# Patient Record
Sex: Female | Born: 1995 | Race: White | Hispanic: No | Marital: Married | Smoking: Never smoker
Health system: Southern US, Community
[De-identification: ages and names within clinical notes are randomized; demographics above are authoritative.]

## PROBLEM LIST (undated history)

## (undated) DIAGNOSIS — D649 Anemia, unspecified: Secondary | ICD-10-CM

## (undated) DIAGNOSIS — F419 Anxiety disorder, unspecified: Secondary | ICD-10-CM

## (undated) DIAGNOSIS — F32A Depression, unspecified: Secondary | ICD-10-CM

## (undated) DIAGNOSIS — F329 Major depressive disorder, single episode, unspecified: Secondary | ICD-10-CM

## (undated) HISTORY — PX: TONSILLECTOMY: SUR1361

---
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
## 2017-01-03 DIAGNOSIS — J309 Allergic rhinitis, unspecified: Secondary | ICD-10-CM | POA: Diagnosis not present

## 2017-01-03 DIAGNOSIS — H6691 Otitis media, unspecified, right ear: Secondary | ICD-10-CM | POA: Diagnosis not present

## 2017-06-11 ENCOUNTER — Encounter: Payer: Self-pay | Admitting: Women's Health

## 2017-06-11 ENCOUNTER — Ambulatory Visit: Payer: Commercial Managed Care - PPO | Admitting: Women's Health

## 2017-06-11 VITALS — BP 118/80 | Ht 66.0 in | Wt 151.0 lb

## 2017-06-11 DIAGNOSIS — Z01419 Encounter for gynecological examination (general) (routine) without abnormal findings: Secondary | ICD-10-CM | POA: Diagnosis not present

## 2017-06-11 DIAGNOSIS — N898 Other specified noninflammatory disorders of vagina: Secondary | ICD-10-CM | POA: Diagnosis not present

## 2017-06-11 DIAGNOSIS — Z30011 Encounter for initial prescription of contraceptive pills: Secondary | ICD-10-CM | POA: Diagnosis not present

## 2017-06-11 LAB — UNLABELED: Test Ordered On Req: 6399

## 2017-06-11 LAB — WET PREP FOR TRICH, YEAST, CLUE

## 2017-06-11 MED ORDER — DESOGESTREL-ETHINYL ESTRADIOL 0.15-0.02/0.01 MG (21/5) PO TABS: 1.0000 | ORAL_TABLET | Freq: Every day | ORAL | 4 refills | Status: DC

## 2017-06-11 MED ORDER — DROSPIRENONE-ETHINYL ESTRADIOL 3-0.02 MG PO TABS: 1.0000 | ORAL_TABLET | Freq: Every day | ORAL | 4 refills | Status: DC

## 2017-06-11 NOTE — Progress Notes (Signed)
Molly BurrowVeronica L Martin 09-Aug-1995 [id number]    History:    Presents for annual exam.  Monthly cycle on Mircette, has had increased acne last few months.  Had Gardasil.  Negative STD screen with current partner.  Has always had some anxiety which increased last year saw primary care and is now on Lexapro with good results.  Past medical history, past surgical history, family history and social history were all reviewed and documented in the EPIC chart.  Desk job.  Parents healthy.  ROS:  A ROS was performed and pertinent positives and negatives are included.  Exam:  Vitals:   06/11/17 1212  BP: 118/80  Weight: 151 lb (68.5 kg)  Height: 5\' 6"  (1.676 m)   Body mass index is 24.37 kg/m.   General appearance:  Normal Thyroid:  Symmetrical, normal in size, without palpable masses or nodularity. Respiratory  Auscultation:  Clear without wheezing or rhonchi Cardiovascular  Auscultation:  Regular rate, without rubs, murmurs or gallops  Edema/varicosities:  Not grossly evident Abdominal  Soft,nontender, without masses, guarding or rebound.  Liver/spleen:  No organomegaly noted  Hernia:  None appreciated  Skin  Inspection:  Grossly normal   Breasts: Examined lying and sitting.     Right: Without masses, retractions, discharge or axillary adenopathy.     Left: Without masses, retractions, discharge or axillary adenopathy. Gentitourinary   Inguinal/mons:  Normal without inguinal adenopathy  External genitalia:  Normal  BUS/Urethra/Skene's glands:  Normal  Vagina:  Normal wet prep negative  Cervix:  Normal  Uterus:  normal in size, shape and contour.  Midline and mobile  Adnexa/parametria:     Rt: Without masses or tenderness.   Lt: Without masses or tenderness.  Anus and perineum: Normal    Assessment/Plan:  22 y.o. S WF G0 for annual exam complaint of vaginal irritation, questionable bump near her vagina.  Contraception management Anxiety/depression stable on Lexapro per primary  care  Plan: Contraception options reviewed, will try Yaz prescription, proper use, ways to remember daily pill, instructed to call if no relief of acne.  SBE's, reviewed importance of increasing regular exercise and decreasing calories, has gained 20 pounds in the past year.  MVI daily encouraged.  Ways to prevent vaginal irritation reviewed will call if continued problems, reviewed normality of exam.  CBC,.    Harrington Challengerancy J Alexi Martin [facility]WHNP, 12:53 PM 06/11/2017

## 2017-06-11 NOTE — Patient Instructions (Signed)

## 2017-07-16 DIAGNOSIS — T7840XA Allergy, unspecified, initial encounter: Secondary | ICD-10-CM | POA: Diagnosis not present

## 2017-07-16 DIAGNOSIS — R21 Rash and other nonspecific skin eruption: Secondary | ICD-10-CM | POA: Diagnosis not present

## 2017-09-21 ENCOUNTER — Encounter (HOSPITAL_COMMUNITY): Payer: Self-pay | Admitting: Emergency Medicine

## 2017-09-21 ENCOUNTER — Ambulatory Visit (HOSPITAL_COMMUNITY)
Admission: EM | Admit: 2017-09-21 | Discharge: 2017-09-21 | Disposition: A | Discharge status: Home / Self Care | Payer: Commercial Managed Care - PPO | Attending: Family Medicine | Admitting: Family Medicine

## 2017-09-21 DIAGNOSIS — R21 Rash and other nonspecific skin eruption: Secondary | ICD-10-CM | POA: Diagnosis not present

## 2017-09-21 MED ORDER — PREDNISONE 20 MG PO TABS: 40.0000 mg | ORAL_TABLET | Freq: Every day | ORAL | 0 refills | Status: AC

## 2017-09-21 NOTE — ED Triage Notes (Signed)
Pt c/o rash all over body, legs face, neck arms back.

## 2017-09-21 NOTE — Discharge Instructions (Signed)
Start prednisone as directed. Over the counter allergy medicine such as zyrtec, allegra to help with itching. Ice compress can help with itching. Please monitor for any exposures that could have caused the symptoms. If experiencing worsening symptoms, increased pain, increased warmth to the rashes, spreading redness, fever, follow up for reevaluation. Otherwise, follow up with PCP for further evaluation if symptoms does not resolve.

## 2017-09-21 NOTE — ED Provider Notes (Signed)
[facility]    CSN: [id number] Arrival date & time: 09/21/17  1452     History   Chief Complaint Chief Complaint  Patient presents with  . Rash    HPI Molly Martin is a 22 y.o. female.   22 year old female comes in for 2-day history of rash over her body.  States this started out on the left side of the face, now spreading to the abdomen, thighs.  States initially started itching, now with slight pain.  Denies spreading erythema, increased warmth, fever. Did get a cat 2 weeks ago.  Otherwise no obvious new contact or exposure.  Denies chest pain, shortness of breath, wheezing, swelling of the throat, swelling of the lips.  Has been applying over-the-counter cream with mild improvement.     Past Medical History:  Diagnosis Date  . Anemia   . Anxiety   . Depression     Patient Active Problem List   Diagnosis Date Noted  . Yeast vaginitis 06/08/2016    Past Surgical History:  Procedure Laterality Date  . TONSILLECTOMY      OB History    Gravida  0   Para  0   Term  0   Preterm  0   AB  0   Living  0     SAB  0   TAB  0   Ectopic  0   Multiple  0   Live Births  0            Home Medications    Prior to Admission medications   Medication Sig Start Date End Date Taking? Authorizing Provider  busPIRone HCl (BUSPAR PO) Take by mouth.   Yes [provider]  drospirenone-ethinyl estradiol (YAZ) 3-0.02 MG tablet Take 1 tablet by mouth daily. 06/11/17   Harrington ChallengerYoung, Nancy J, NP  escitalopram (LEXAPRO) 10 MG tablet Take 10 mg by mouth daily.    [provider]  naproxen (NAPROSYN) 500 MG tablet Take 1 tablet (500 mg total) by mouth 2 (two) times daily. 12/15/16   Antony MaduraHumes, Kelly, PA-C  predniSONE (DELTASONE) 20 MG tablet Take 2 tablets (40 mg total) by mouth daily for 5 days. 09/21/17 09/26/17  Belinda FisherYu, Amy V, PA-C    Family History Family History  Problem Relation Age of Onset  . Bipolar disorder Mother   . Hypertension Father     . Depression Sister   . Stroke Maternal Grandmother   . Cancer Maternal Grandfather   . Anxiety disorder Paternal Grandmother     Social History Social History   Tobacco Use  . Smoking status: Never Smoker  . Smokeless tobacco: Never Used  Substance Use Topics  . Alcohol use: No    Frequency: Never  . Drug use: No     Allergies   Patient has no known allergies.   Review of Systems Review of Systems  Reason unable to perform ROS: See HPI as above.     Physical Exam Triage Vital Signs ED Triage Vitals [09/21/17 1557]  Enc Vitals Group     BP 128/63     Pulse Rate 75     Resp 16     Temp (!) 97.4 F (36.3 C)     Temp src      SpO2 100 %     Weight      Height      Head Circumference      Peak Flow      Pain Score 5  Pain Loc      Pain Edu?      Excl. in GC?    No data found.  Updated Vital Signs BP 128/63   Pulse 75   Temp (!) 97.4 F (36.3 C)   Resp 16   SpO2 100%   Physical Exam  Constitutional: She is oriented to person, place, and time. She appears well-developed and well-nourished. No distress.  HENT:  Head: Normocephalic and atraumatic.  Eyes: Pupils are equal, round, and reactive to light. Conjunctivae are normal.  Neurological: She is alert and oriented to person, place, and time.  Skin: Skin is warm and dry. She is not diaphoretic.  See picture below. No increased warmth. Nontender on palpation. No fluctuance felt.               UC Treatments / Results  Labs (all labs ordered are listed, but only abnormal results are displayed) Labs Reviewed - No data to display  EKG None  Radiology No results found.  Procedures Procedures (including critical care time)  Medications Ordered in UC Medications - No data to display  Initial Impression / Assessment and Plan / UC Course  I have reviewed the triage vital signs and the nursing notes.  Pertinent labs & imaging results that were available during my care of the  patient were reviewed by me and considered in my medical decision making (see chart for details).    Will treat for inflammation/deramatitis with prednisone. Antihistamine to help with itching. Patient to monitor for exposures, check cat for fleas. Return precautions given.   Final Clinical Impressions(s) / UC Diagnoses   Final diagnoses:  Rash    ED Prescriptions    Medication Sig Dispense Auth. Provider   predniSONE (DELTASONE) 20 MG tablet Take 2 tablets (40 mg total) by mouth daily for 5 days. 10 tablet Threasa Alpha, PA-C 09/21/17 1800

## 2017-09-28 DIAGNOSIS — Z23 Encounter for immunization: Secondary | ICD-10-CM | POA: Diagnosis not present

## 2017-09-28 DIAGNOSIS — D649 Anemia, unspecified: Secondary | ICD-10-CM | POA: Diagnosis not present

## 2017-10-04 DIAGNOSIS — R251 Tremor, unspecified: Secondary | ICD-10-CM | POA: Diagnosis not present

## 2017-11-08 DIAGNOSIS — Z23 Encounter for immunization: Secondary | ICD-10-CM | POA: Diagnosis not present

## 2017-11-08 DIAGNOSIS — L309 Dermatitis, unspecified: Secondary | ICD-10-CM | POA: Diagnosis not present

## 2017-11-08 DIAGNOSIS — Z0000 Encounter for general adult medical examination without abnormal findings: Secondary | ICD-10-CM | POA: Diagnosis not present

## 2017-11-16 DIAGNOSIS — Z6825 Body mass index (BMI) 25.0-25.9, adult: Secondary | ICD-10-CM | POA: Diagnosis not present

## 2017-11-16 DIAGNOSIS — Z0000 Encounter for general adult medical examination without abnormal findings: Secondary | ICD-10-CM | POA: Diagnosis not present

## 2018-02-25 ENCOUNTER — Encounter: Payer: Self-pay | Admitting: Women's Health

## 2018-02-25 ENCOUNTER — Ambulatory Visit (INDEPENDENT_AMBULATORY_CARE_PROVIDER_SITE_OTHER): Payer: Commercial Managed Care - PPO | Admitting: Women's Health

## 2018-02-25 VITALS — BP 116/70

## 2018-02-25 DIAGNOSIS — Z3041 Encounter for surveillance of contraceptive pills: Secondary | ICD-10-CM

## 2018-02-25 MED ORDER — NORGESTIMATE-ETH ESTRADIOL 0.25-35 MG-MCG PO TABS: 1.0000 | ORAL_TABLET | Freq: Every day | ORAL | 2 refills | Status: DC

## 2018-02-25 NOTE — Progress Notes (Signed)
22 year old MWF G0 presents with questions concerning birth control pills.  States is having a 3-day cycle heavy flow with blood clots and questions if that is normal.  States cycles are lighter flow than prior with less cramping but states blood clots scared her.  Currently on Lexapro and BuSpar per primary care for anxiety/depression which have helped symptoms.  Reports good relationship with husband, negative STD screen.  Denies abdominal/ back pain, urinary symptoms, vaginal discharge or fever.  Exam: Appears well.  No CVAT.  Abdomen soft nontender, external genitalia within normal limits,  Contraception management  Plan: Contraception options reviewed, would like to try a different pill, Sprintec prescription, proper use given and reviewed slight risk for blood clots and strokes. Reassurance given regarding normality of some blood clotting with menses on OCs.

## 2018-03-20 NOTE — L&D Delivery Note (Signed)
Operative Delivery Note   Fetal Bradycardia with pushing down to 60s . At 10:47 PM a viable female was delivered via Vaginal, Vacuum Neurosurgeon).  Presentation: vertex; Position: Right,, Occiput,, Anterior; Station: +3.  Verbal consent: obtained from patient.  Risks and benefits discussed in detail.  Risks include, but are not limited to the risks of anesthesia, bleeding, infection, damage to maternal tissues, fetal cephalhematoma.  There is also the risk of inability to effect vaginal delivery of the head, or shoulder dystocia that cannot be resolved by established maneuvers, leading to the need for emergency cesarean section.  APGAR: 7, 9; weight  pending .   Placenta status:spontaneously with 3 vessel cord , .   Cord:  with the following complications: SHORT.  Cord pH: not obtained  Anesthesia:  epidural Instruments: mushroom - delivered with no popoffs and 4 pulls Episiotomy: None Lacerations:  2nd  Suture Repair: 3.0 chromic Est. Blood Loss (mL): 300  Mom to postpartum.  Baby to Couplet care / Skin to Skin.  MOM TO RECEIVE MAGNESIUM POSTPARTUM FOR PREECLAMPSIA  Cyril Mourning 12/23/2018, 11:07 PM

## 2018-03-31 ENCOUNTER — Emergency Department (HOSPITAL_COMMUNITY): Payer: BLUE CROSS/BLUE SHIELD | Admitting: Certified Registered Nurse Anesthetist

## 2018-03-31 ENCOUNTER — Emergency Department (HOSPITAL_COMMUNITY)
Admission: EM | Admit: 2018-03-31 | Discharge: 2018-03-31 | Disposition: A | Discharge status: Home / Self Care | Payer: BLUE CROSS/BLUE SHIELD | Attending: Emergency Medicine | Admitting: Emergency Medicine

## 2018-03-31 ENCOUNTER — Encounter (HOSPITAL_COMMUNITY): Payer: Self-pay | Admitting: Emergency Medicine

## 2018-03-31 ENCOUNTER — Emergency Department (HOSPITAL_COMMUNITY): Payer: BLUE CROSS/BLUE SHIELD

## 2018-03-31 ENCOUNTER — Other Ambulatory Visit: Payer: Self-pay

## 2018-03-31 ENCOUNTER — Encounter (HOSPITAL_COMMUNITY)
Admission: EM | Disposition: A | Discharge status: Home / Self Care | Payer: Self-pay | Source: Home / Self Care | Attending: Emergency Medicine

## 2018-03-31 DIAGNOSIS — S61257A Open bite of left little finger without damage to nail, initial encounter: Secondary | ICD-10-CM | POA: Insufficient documentation

## 2018-03-31 DIAGNOSIS — Z2914 Encounter for prophylactic rabies immune globin: Secondary | ICD-10-CM | POA: Insufficient documentation

## 2018-03-31 DIAGNOSIS — Y9301 Activity, walking, marching and hiking: Secondary | ICD-10-CM | POA: Diagnosis not present

## 2018-03-31 DIAGNOSIS — F329 Major depressive disorder, single episode, unspecified: Secondary | ICD-10-CM | POA: Diagnosis not present

## 2018-03-31 DIAGNOSIS — L089 Local infection of the skin and subcutaneous tissue, unspecified: Secondary | ICD-10-CM | POA: Diagnosis not present

## 2018-03-31 DIAGNOSIS — W540XXA Bitten by dog, initial encounter: Secondary | ICD-10-CM | POA: Insufficient documentation

## 2018-03-31 DIAGNOSIS — Z793 Long term (current) use of hormonal contraceptives: Secondary | ICD-10-CM | POA: Insufficient documentation

## 2018-03-31 DIAGNOSIS — F419 Anxiety disorder, unspecified: Secondary | ICD-10-CM | POA: Insufficient documentation

## 2018-03-31 DIAGNOSIS — Z791 Long term (current) use of non-steroidal anti-inflammatories (NSAID): Secondary | ICD-10-CM | POA: Diagnosis not present

## 2018-03-31 DIAGNOSIS — Z79899 Other long term (current) drug therapy: Secondary | ICD-10-CM | POA: Diagnosis not present

## 2018-03-31 DIAGNOSIS — Z23 Encounter for immunization: Secondary | ICD-10-CM | POA: Diagnosis not present

## 2018-03-31 DIAGNOSIS — S6992XA Unspecified injury of left wrist, hand and finger(s), initial encounter: Secondary | ICD-10-CM | POA: Diagnosis present

## 2018-03-31 DIAGNOSIS — S61452A Open bite of left hand, initial encounter: Secondary | ICD-10-CM | POA: Diagnosis present

## 2018-03-31 HISTORY — PX: I&D EXTREMITY: SHX5045

## 2018-03-31 LAB — CBC WITH DIFFERENTIAL/PLATELET
Abs Immature Granulocytes: 0.02 10*3/uL (ref 0.00–0.07)
Basophils Absolute: 0 10*3/uL (ref 0.0–0.1)
Basophils Relative: 0 %
EOS ABS: 0.1 10*3/uL (ref 0.0–0.5)
Eosinophils Relative: 2 %
HCT: 39.8 % (ref 36.0–46.0)
Hemoglobin: 13 g/dL (ref 12.0–15.0)
IMMATURE GRANULOCYTES: 0 %
LYMPHS ABS: 2.8 10*3/uL (ref 0.7–4.0)
Lymphocytes Relative: 33 %
MCH: 28.3 pg (ref 26.0–34.0)
MCHC: 32.7 g/dL (ref 30.0–36.0)
MCV: 86.7 fL (ref 80.0–100.0)
Monocytes Absolute: 0.6 10*3/uL (ref 0.1–1.0)
Monocytes Relative: 7 %
Neutro Abs: 4.8 10*3/uL (ref 1.7–7.7)
Neutrophils Relative %: 58 %
Platelets: 302 10*3/uL (ref 150–400)
RBC: 4.59 MIL/uL (ref 3.87–5.11)
RDW: 11.7 % (ref 11.5–15.5)
WBC: 8.4 10*3/uL (ref 4.0–10.5)
nRBC: 0 % (ref 0.0–0.2)

## 2018-03-31 LAB — BASIC METABOLIC PANEL
Anion gap: 6 (ref 5–15)
BUN: 12 mg/dL (ref 6–20)
CALCIUM: 9.1 mg/dL (ref 8.9–10.3)
CO2: 26 mmol/L (ref 22–32)
Chloride: 106 mmol/L (ref 98–111)
Creatinine, Ser: 0.69 mg/dL (ref 0.44–1.00)
GFR calc Af Amer: >60 mL/min (ref >60)
GFR calc non Af Amer: >60 mL/min (ref >60)
Glucose, Bld: 121 mg/dL — ABNORMAL HIGH (ref 70–99)
Potassium: 3.6 mmol/L (ref 3.5–5.1)
Sodium: 138 mmol/L (ref 135–145)

## 2018-03-31 LAB — I-STAT BETA HCG BLOOD, ED (MC, WL, AP ONLY): I-stat hCG, quantitative: <5 m[IU]/mL (ref <5)

## 2018-03-31 SURGERY — IRRIGATION AND DEBRIDEMENT EXTREMITY
Anesthesia: General | Laterality: Right

## 2018-03-31 MED ORDER — IBUPROFEN 600 MG PO TABS: 600.0000 mg | ORAL_TABLET | Freq: Three times a day (TID) | ORAL | 0 refills | Status: DC | PRN

## 2018-03-31 MED ORDER — CEFAZOLIN SODIUM-DEXTROSE 2-3 GM-%(50ML) IV SOLR
INTRAVENOUS | Status: DC | PRN
  Administered 2018-03-31: 2 g via INTRAVENOUS

## 2018-03-31 MED ORDER — ACETAMINOPHEN 10 MG/ML IV SOLN: 1000.0000 mg | Freq: Once | INTRAVENOUS | Status: DC

## 2018-03-31 MED ORDER — HYDROCODONE-ACETAMINOPHEN 5-325 MG PO TABS: 1.0000 | ORAL_TABLET | Freq: Four times a day (QID) | ORAL | 0 refills | Status: DC | PRN

## 2018-03-31 MED ORDER — PHENYLEPHRINE 40 MCG/ML (10ML) SYRINGE FOR IV PUSH (FOR BLOOD PRESSURE SUPPORT)
PREFILLED_SYRINGE | INTRAVENOUS | Status: AC
  Filled 2018-03-31: qty 10

## 2018-03-31 MED ORDER — ONDANSETRON HCL 4 MG PO TABS: 4.0000 mg | ORAL_TABLET | Freq: Three times a day (TID) | ORAL | 0 refills | Status: DC | PRN

## 2018-03-31 MED ORDER — RABIES IMMUNE GLOBULIN 150 UNIT/ML IM INJ
20.0000 [IU]/kg | INJECTION | Freq: Once | INTRAMUSCULAR | Status: AC
  Administered 2018-03-31: 1500 [IU] via INTRAMUSCULAR
  Filled 2018-03-31: qty 10

## 2018-03-31 MED ORDER — ONDANSETRON HCL 4 MG/2ML IJ SOLN
INTRAMUSCULAR | Status: AC
  Filled 2018-03-31: qty 2

## 2018-03-31 MED ORDER — HYDROMORPHONE HCL 1 MG/ML IJ SOLN: 0.2500 mg | INTRAMUSCULAR | Status: DC | PRN

## 2018-03-31 MED ORDER — PROPOFOL 10 MG/ML IV BOLUS
INTRAVENOUS | Status: DC | PRN
  Administered 2018-03-31: 200 mg via INTRAVENOUS

## 2018-03-31 MED ORDER — RABIES VACCINE, PCEC IM SUSR
1.0000 mL | Freq: Once | INTRAMUSCULAR | Status: AC
  Administered 2018-03-31: 1 mL via INTRAMUSCULAR
  Filled 2018-03-31: qty 1

## 2018-03-31 MED ORDER — ACETAMINOPHEN 10 MG/ML IV SOLN
INTRAVENOUS | Status: AC
  Administered 2018-03-31: 1000 mg
  Filled 2018-03-31: qty 100

## 2018-03-31 MED ORDER — DEXAMETHASONE SODIUM PHOSPHATE 10 MG/ML IJ SOLN
INTRAMUSCULAR | Status: DC | PRN
  Administered 2018-03-31: 8 mg via INTRAVENOUS

## 2018-03-31 MED ORDER — ARTIFICIAL TEARS OP OINT
TOPICAL_OINTMENT | OPHTHALMIC | Status: AC
  Filled 2018-03-31: qty 3.5

## 2018-03-31 MED ORDER — LACTATED RINGERS IV SOLN
INTRAVENOUS | Status: DC | PRN
  Administered 2018-03-31: 19:00:00 via INTRAVENOUS

## 2018-03-31 MED ORDER — KETOROLAC TROMETHAMINE 30 MG/ML IJ SOLN: 30.0000 mg | Freq: Once | INTRAMUSCULAR | Status: DC | PRN

## 2018-03-31 MED ORDER — ONDANSETRON HCL 4 MG/2ML IJ SOLN: 4.0000 mg | Freq: Once | INTRAMUSCULAR | Status: DC | PRN

## 2018-03-31 MED ORDER — MIDAZOLAM HCL 5 MG/5ML IJ SOLN
INTRAMUSCULAR | Status: DC | PRN
  Administered 2018-03-31: 2 mg via INTRAVENOUS

## 2018-03-31 MED ORDER — HYDROMORPHONE HCL 1 MG/ML IJ SOLN
INTRAMUSCULAR | Status: AC
  Filled 2018-03-31: qty 1

## 2018-03-31 MED ORDER — DEXAMETHASONE SODIUM PHOSPHATE 10 MG/ML IJ SOLN
INTRAMUSCULAR | Status: AC
  Filled 2018-03-31: qty 1

## 2018-03-31 MED ORDER — MEPERIDINE HCL 50 MG/ML IJ SOLN: 6.2500 mg | INTRAMUSCULAR | Status: DC | PRN

## 2018-03-31 MED ORDER — LIDOCAINE 2% (20 MG/ML) 5 ML SYRINGE
INTRAMUSCULAR | Status: DC | PRN
  Administered 2018-03-31: 100 mg via INTRAVENOUS

## 2018-03-31 MED ORDER — SODIUM CHLORIDE 0.9 % IR SOLN
Status: DC | PRN
  Administered 2018-03-31: 1000 mL

## 2018-03-31 MED ORDER — FENTANYL CITRATE (PF) 100 MCG/2ML IJ SOLN
INTRAMUSCULAR | Status: AC
  Filled 2018-03-31: qty 2

## 2018-03-31 MED ORDER — ONDANSETRON HCL 4 MG/2ML IJ SOLN
INTRAMUSCULAR | Status: DC | PRN
  Administered 2018-03-31: 4 mg via INTRAVENOUS

## 2018-03-31 MED ORDER — AMOXICILLIN-POT CLAVULANATE 875-125 MG PO TABS: 1.0000 | ORAL_TABLET | Freq: Two times a day (BID) | ORAL | 0 refills | Status: AC

## 2018-03-31 MED ORDER — HYDROCODONE-ACETAMINOPHEN 7.5-325 MG PO TABS: 1.0000 | ORAL_TABLET | Freq: Once | ORAL | Status: DC | PRN

## 2018-03-31 MED ORDER — PROPOFOL 10 MG/ML IV BOLUS
INTRAVENOUS | Status: AC
  Filled 2018-03-31: qty 20

## 2018-03-31 MED ORDER — MIDAZOLAM HCL 2 MG/2ML IJ SOLN
INTRAMUSCULAR | Status: AC
  Filled 2018-03-31: qty 2

## 2018-03-31 MED ORDER — LORAZEPAM 2 MG/ML IJ SOLN
1.0000 mg | Freq: Once | INTRAMUSCULAR | Status: AC
  Administered 2018-03-31: 1 mg via INTRAVENOUS
  Filled 2018-03-31: qty 1

## 2018-03-31 MED ORDER — PHENYLEPHRINE 40 MCG/ML (10ML) SYRINGE FOR IV PUSH (FOR BLOOD PRESSURE SUPPORT)
PREFILLED_SYRINGE | INTRAVENOUS | Status: DC | PRN
  Administered 2018-03-31: 40 ug via INTRAVENOUS

## 2018-03-31 MED ORDER — TETANUS-DIPHTH-ACELL PERTUSSIS 5-2.5-18.5 LF-MCG/0.5 IM SUSP
0.5000 mL | Freq: Once | INTRAMUSCULAR | Status: AC
  Administered 2018-03-31: 0.5 mL via INTRAMUSCULAR
  Filled 2018-03-31: qty 0.5

## 2018-03-31 MED ORDER — HYDROMORPHONE HCL 1 MG/ML IJ SOLN
0.2500 mg | INTRAMUSCULAR | Status: DC | PRN
  Administered 2018-03-31 (×2): 0.5 mg via INTRAVENOUS

## 2018-03-31 MED ORDER — LIDOCAINE 2% (20 MG/ML) 5 ML SYRINGE
INTRAMUSCULAR | Status: AC
  Filled 2018-03-31: qty 5

## 2018-03-31 MED ORDER — LACTATED RINGERS IV SOLN: INTRAVENOUS | Status: DC

## 2018-03-31 MED ORDER — SUCCINYLCHOLINE CHLORIDE 200 MG/10ML IV SOSY
PREFILLED_SYRINGE | INTRAVENOUS | Status: DC | PRN
  Administered 2018-03-31: 80 mg via INTRAVENOUS

## 2018-03-31 MED ORDER — CEFAZOLIN SODIUM-DEXTROSE 2-4 GM/100ML-% IV SOLN
INTRAVENOUS | Status: AC
  Filled 2018-03-31: qty 100

## 2018-03-31 MED ORDER — FENTANYL CITRATE (PF) 100 MCG/2ML IJ SOLN
INTRAMUSCULAR | Status: DC | PRN
  Administered 2018-03-31: 75 ug via INTRAVENOUS
  Administered 2018-03-31: 25 ug via INTRAVENOUS
  Administered 2018-03-31 (×2): 50 ug via INTRAVENOUS

## 2018-03-31 SURGICAL SUPPLY — 32 items
BANDAGE ACE 4X5 VEL STRL LF (GAUZE/BANDAGES/DRESSINGS) IMPLANT
BANDAGE ELASTIC 4 VELCRO ST LF (GAUZE/BANDAGES/DRESSINGS) ×3 IMPLANT
BNDG ELASTIC 2 VLCR STRL LF (GAUZE/BANDAGES/DRESSINGS) ×3 IMPLANT
BNDG GAUZE ELAST 4 BULKY (GAUZE/BANDAGES/DRESSINGS) ×3 IMPLANT
COVER SURGICAL LIGHT HANDLE (MISCELLANEOUS) ×3 IMPLANT
COVER WAND RF STERILE (DRAPES) IMPLANT
CUFF TOURN SGL QUICK 18 (TOURNIQUET CUFF) ×3 IMPLANT
DRAIN PENROSE 18X1/4 LTX STRL (WOUND CARE) IMPLANT
DRSG EMULSION OIL 3X3 NADH (GAUZE/BANDAGES/DRESSINGS) ×3 IMPLANT
DRSG PAD ABDOMINAL 8X10 ST (GAUZE/BANDAGES/DRESSINGS) IMPLANT
DURAPREP 26ML APPLICATOR (WOUND CARE) ×3 IMPLANT
ELECT REM PT RETURN 15FT ADLT (MISCELLANEOUS) ×3 IMPLANT
GAUZE SPONGE 4X4 12PLY STRL (GAUZE/BANDAGES/DRESSINGS) IMPLANT
GLOVE BIO SURGEON STRL SZ8 (GLOVE) ×3 IMPLANT
GOWN STRL REUS W/TWL XL LVL3 (GOWN DISPOSABLE) ×3 IMPLANT
KIT BASIN OR (CUSTOM PROCEDURE TRAY) ×3 IMPLANT
LOOP VESSEL MAXI BLUE (MISCELLANEOUS) ×3 IMPLANT
MANIFOLD NEPTUNE II (INSTRUMENTS) ×3 IMPLANT
PACK ORTHO EXTREMITY (CUSTOM PROCEDURE TRAY) ×3 IMPLANT
PROTECTOR NERVE ULNAR (MISCELLANEOUS) ×3 IMPLANT
SOL PREP POV-IOD 4OZ 10% (MISCELLANEOUS) ×3 IMPLANT
SOL PREP PROV IODINE SCRUB 4OZ (MISCELLANEOUS) ×3 IMPLANT
SUT PROLENE 3 0 PS 2 (SUTURE) IMPLANT
SUT VIC AB 1 CT1 27 (SUTURE)
SUT VIC AB 1 CT1 27XBRD ANTBC (SUTURE) IMPLANT
SUT VIC AB 2-0 CT1 27 (SUTURE)
SUT VIC AB 2-0 CT1 27XBRD (SUTURE) IMPLANT
SWAB COLLECTION DEVICE MRSA (MISCELLANEOUS) ×3 IMPLANT
SWAB CULTURE ESWAB REG 1ML (MISCELLANEOUS) ×3 IMPLANT
SYR 20CC LL (SYRINGE) IMPLANT
SYR CONTROL 10ML LL (SYRINGE) IMPLANT
TOWEL OR 17X26 10 PK STRL BLUE (TOWEL DISPOSABLE) ×3 IMPLANT

## 2018-03-31 NOTE — Consult Note (Signed)
ORTHOPAEDIC CONSULTATION  REQUESTING PHYSICIAN: Nat Christen, MD  Chief Complaint: Left small finger dog bite  HPI: Molly Martin is a 22 y.o. female who complains of a dog bite to her volar small finger yesterday. She has developed pain swelling and redness over the night and today. Pain with any motion at the finger at this point.   Past Medical History:  Diagnosis Date  . Anemia   . Anxiety   . Depression    Past Surgical History:  Procedure Laterality Date  . TONSILLECTOMY     Social History   Socioeconomic History  . Marital status: Single    Spouse name: Not on file  . Number of children: Not on file  . Years of education: Not on file  . Highest education level: Not on file  Occupational History  . Not on file  Social Needs  . Financial resource strain: Not on file  . Food insecurity:    Worry: Not on file    Inability: Not on file  . Transportation needs:    Medical: Not on file    Non-medical: Not on file  Tobacco Use  . Smoking status: Never Smoker  . Smokeless tobacco: Never Used  Substance and Sexual Activity  . Alcohol use: No    Frequency: Never  . Drug use: No  . Sexual activity: Yes    Partners: Male    Birth control/protection: Condom    Comment: 17, NO MORE THAN 5 PARTNERS  Lifestyle  . Physical activity:    Days per week: Not on file    Minutes per session: Not on file  . Stress: Not on file  Relationships  . Social connections:    Talks on phone: Not on file    Gets together: Not on file    Attends religious service: Not on file    Active member of club or organization: Not on file    Attends meetings of clubs or organizations: Not on file    Relationship status: Not on file  Other Topics Concern  . Not on file  Social History Narrative  . Not on file   Family History  Problem Relation Age of Onset  . Bipolar disorder Mother   . Hypertension Father   . Depression Sister   . Stroke Maternal Grandmother   . Cancer  Maternal Grandfather   . Anxiety disorder Paternal Grandmother    No Known Allergies Prior to Admission medications   Medication Sig Start Date End Date Taking? Authorizing Provider  busPIRone HCl (BUSPAR PO) Take by mouth.    [provider]  escitalopram (LEXAPRO) 10 MG tablet Take 10 mg by mouth daily.    [provider]  naproxen (NAPROSYN) 500 MG tablet Take 1 tablet (500 mg total) by mouth 2 (two) times daily. 12/15/16   Antonietta Breach, PA-C  norgestimate-ethinyl estradiol (ORTHO-CYCLEN,SPRINTEC,PREVIFEM) 0.25-35 MG-MCG tablet Take 1 tablet by mouth daily. 02/25/18   Huel Cote, NP   No results found.  Positive ROS: All other systems have been reviewed and were otherwise negative with the exception of those mentioned in the HPI and as above.  Labs cbc Recent Labs    03/31/18 1705  WBC 8.4  HGB 13.0  HCT 39.8  PLT 302    Labs inflam No results for input(s): CRP in the last 72 hours.  Invalid input(s): ESR  Labs coag No results for input(s): INR, PTT in the last 72 hours.  Invalid input(s):  PT  No results for input(s): NA, K, CL, CO2, GLUCOSE, BUN, CREATININE, CALCIUM in the last 72 hours.  Physical Exam: Vitals:   03/31/18 1616  BP: 122/73  Pulse: 84  Resp: 16  Temp: 98.5 F (36.9 C)  SpO2: 96%   General: Alert, no acute distress Cardiovascular: No pedal edema Respiratory: No cyanosis, no use of accessory musculature GI: No organomegaly, abdomen is soft and non-tender Skin: No lesions in the area of chief complaint other than those listed below in MSK exam.  Neurologic: Sensation intact distally save for the below mentioned MSK exam Psychiatric: Patient is competent for consent with normal mood and affect Lymphatic: No axillary or cervical lymphadenopathy  MUSCULOSKELETAL:  LUE: pain with ROM, limited activ flexion. Erythema over P1 and MP flexor crease. NVI. Swelling over P1 and just proximal to flexor crease of MP joint.  Other  extremities are atraumatic with painless ROM and NVI.  Assessment: Volar finger infection, possible extension to flexor tendon.   Plan: I discussed the R&B of plan with the patient and her husband. Given possible flexor tendon involvment and possible infection to the tendon sheath I recommend I&D of the volar finger, possible tenolysis.  Will start abx post op and follow cultures.  Discussed possible post op discharge with patient and husband and they would like to go forward with this if possible.    Renette Butters, MD Cell [phone]   03/31/2018 5:49 PM

## 2018-03-31 NOTE — Discharge Instructions (Signed)
Elevate hand at all times.  Diet: As you were doing prior to hospitalization   Dressing:  Keep dressings on and dry until follow up.  Activity:  Increase activity slowly as tolerated, but follow the weight bearing instructions below.  The rules on driving is that you can not be taking narcotics while you drive, and you must feel in control of the vehicle.    Weight Bearing:   Do not lift with left hand.  Elevate at all times to reduce pain / swelling.    To prevent constipation: you may use a stool softener such as -  Colace (over the counter) 100 mg by mouth twice a day  Drink plenty of fluids (prune juice may be helpful) and high fiber foods Miralax (over the counter) for constipation as needed.    Itching:  If you experience itching with your medications, try taking only a single pain pill, or even half a pain pill at a time.  You can also use benadryl over the counter for itching or also to help with sleep.   Precautions:  If you experience chest pain or shortness of breath - call 911 immediately for transfer to the hospital emergency department!!  If you develop a fever greater that 101 F, purulent drainage from wound, increased redness or drainage from wound, or calf pain -- Call the office at [phone]                                                 Follow- Up Appointment:  Please call for an appointment to be seen this Friday 04/05/18 in [place] - [phone]

## 2018-03-31 NOTE — Anesthesia Preprocedure Evaluation (Addendum)
Anesthesia Evaluation  Patient identified by MRN, date of birth, ID band Patient awake    Reviewed: Allergy & Precautions, H&P , NPO status , Patient's Chart, lab work & pertinent test results  Airway Mallampati: II  TM Distance: >3 FB Neck ROM: Full    Dental no notable dental hx. (+) Teeth Intact, Dental Advisory Given   Pulmonary neg pulmonary ROS,    Pulmonary exam normal breath sounds clear to auscultation       Cardiovascular Exercise Tolerance: Good negative cardio ROS Normal cardiovascular exam Rhythm:Regular Rate:Normal     Neuro/Psych Anxiety negative neurological ROS     GI/Hepatic negative GI ROS, Neg liver ROS,   Endo/Other  negative endocrine ROS  Renal/GU negative Renal ROS     Musculoskeletal negative musculoskeletal ROS (+)   Abdominal   Peds  Hematology  (+) Blood dyscrasia, anemia ,   Anesthesia Other Findings   Reproductive/Obstetrics                            Lab Results  Component Value Date   CREATININE 0.69 03/31/2018   BUN 12 03/31/2018   NA 138 03/31/2018   K 3.6 03/31/2018   CL 106 03/31/2018   CO2 26 03/31/2018    Lab Results  Component Value Date   WBC 8.4 03/31/2018   HGB 13.0 03/31/2018   HCT 39.8 03/31/2018   MCV 86.7 03/31/2018   PLT 302 03/31/2018    Anesthesia Physical Anesthesia Plan  ASA: I and emergent  Anesthesia Plan: General   Post-op Pain Management:    Induction: Intravenous, Cricoid pressure planned and Rapid sequence  PONV Risk Score and Plan: 3 and Treatment may vary due to age or medical condition, Ondansetron and Dexamethasone  Airway Management Planned: Oral ETT  Additional Equipment:   Intra-op Plan:   Post-operative Plan: Extubation in OR  Informed Consent: I have reviewed the patients History and Physical, chart, labs and discussed the procedure including the risks, benefits and alternatives for the proposed  anesthesia with the patient or authorized representative who has indicated his/her understanding and acceptance.   Dental advisory given  Plan Discussed with: CRNA  Anesthesia Plan Comments:        Anesthesia Quick Evaluation

## 2018-03-31 NOTE — ED Provider Notes (Signed)
[facility]-EMERGENCY DEPT Provider Note   CSN: [id number] Arrival date & time: 03/31/18  1558     History   Chief Complaint Chief Complaint  Patient presents with  . Animal Bite    HPI Molly Martin is a 23 y.o. female with a PMHx of anxiety, anemia, and depression, who presents to the ED with complaints of left hand dog bite that occurred yesterday.  Patient states that she tried to separate a stray dog from her dog, and the stray dog bit her.  They contacted animal control but the dog was unable to be found.  They are unsure of the vaccine status of the dog.  She states that today she woke up with her left pinky swollen and painful, she describes the pain is sick/10 constant throbbing left pinky pain that radiates into the hand, worse with movement, and unrelieved with Tylenol and ice.  She reports associated puncture wound, bleeding has stopped, but she has some white drainage coming from it.  She reports associated swelling, erythema, and warmth.  She denies any red streaking up the arm, fevers, chills, numbness, tingling, focal weakness, or any other complaints at this time.  Her tetanus shot was more than 5 years ago.  The history is provided by the patient and medical records. No language interpreter was used.  Animal Bite  Associated symptoms: no fever and no numbness     Past Medical History:  Diagnosis Date  . Anemia   . Anxiety   . Depression     Patient Active Problem List   Diagnosis Date Noted  . Yeast vaginitis 06/08/2016    Past Surgical History:  Procedure Laterality Date  . TONSILLECTOMY       OB History    Gravida  0   Para  0   Term  0   Preterm  0   AB  0   Living  0     SAB  0   TAB  0   Ectopic  0   Multiple  0   Live Births  0            Home Medications    Prior to Admission medications   Medication Sig Start Date End Date Taking? Authorizing Provider  busPIRone HCl (BUSPAR PO) Take by mouth.     [provider]  escitalopram (LEXAPRO) 10 MG tablet Take 10 mg by mouth daily.    [provider]  naproxen (NAPROSYN) 500 MG tablet Take 1 tablet (500 mg total) by mouth 2 (two) times daily. 12/15/16   Antony MaduraHumes, Kelly, PA-C  norgestimate-ethinyl estradiol (ORTHO-CYCLEN,SPRINTEC,PREVIFEM) 0.25-35 MG-MCG tablet Take 1 tablet by mouth daily. 02/25/18   Harrington ChallengerYoung, Nancy J, NP    Family History Family History  Problem Relation Age of Onset  . Bipolar disorder Mother   . Hypertension Father   . Depression Sister   . Stroke Maternal Grandmother   . Cancer Maternal Grandfather   . Anxiety disorder Paternal Grandmother     Social History Social History   Tobacco Use  . Smoking status: Never Smoker  . Smokeless tobacco: Never Used  Substance Use Topics  . Alcohol use: No    Frequency: Never  . Drug use: No     Allergies   Patient has no known allergies.   Review of Systems Review of Systems  Constitutional: Negative for chills and fever.  Musculoskeletal: Positive for arthralgias, joint swelling and myalgias.  Skin: Positive for color change  and wound.  Allergic/Immunologic: Negative for immunocompromised state.  Neurological: Negative for weakness and numbness.     Physical Exam Updated Vital Signs BP 122/73 (BP Location: Right Arm)   Pulse 84   Temp 98.5 F (36.9 C) (Oral)   Resp 16   LMP 03/03/2018 (Approximate)   SpO2 96%   Physical Exam Vitals signs and nursing note reviewed.  Constitutional:      General: She is not in acute distress.    Appearance: Normal appearance. She is well-developed. She is not toxic-appearing.     Comments: Afebrile, nontoxic, NAD  HENT:     Head: Normocephalic and atraumatic.  Eyes:     General:        Right eye: No discharge.        Left eye: No discharge.     Conjunctiva/sclera: Conjunctivae normal.  Neck:     Musculoskeletal: Normal range of motion and neck supple.  Cardiovascular:     Rate and Rhythm: Normal  rate.     Pulses: Normal pulses.  Pulmonary:     Effort: Pulmonary effort is normal. No respiratory distress.  Abdominal:     General: There is no distension.  Musculoskeletal:     Left hand: She exhibits decreased range of motion, tenderness, bony tenderness, laceration and swelling. She exhibits normal two-point discrimination, normal capillary refill and no deformity. Normal sensation noted. Normal strength noted.     Comments: L pinky with swelling, warmth, and erythema along the palmar aspect of the digit, small puncture wound to the PIP joint region on the palmar aspect, no drainage appreciated, slightly indurated without fluctuance, no red streaking but erythema extends to palm, limited ROM due to pain and swelling, no crepitus or deformity, moderate TTP along digit particularly on the palmar aspect, pain with passive ROM both flexion and extension. SEE PICTURE BELOW. Strength and sensation grossly intact, distal pulses intact, soft compartments.   Skin:    General: Skin is warm and dry.     Findings: Signs of injury present. No rash.     Comments: L pinky wound as mentioned above and pictured below  Neurological:     Mental Status: She is alert and oriented to person, place, and time.     Sensory: Sensation is intact. No sensory deficit.     Motor: Motor function is intact.  Psychiatric:        Mood and Affect: Mood and affect normal.        Behavior: Behavior normal.        ED Treatments / Results  Labs (all labs ordered are listed, but only abnormal results are displayed) Labs Reviewed  BASIC METABOLIC PANEL - Abnormal; Notable for the following components:      Result Value   Glucose, Bld 121 (*)    All other components within normal limits  CBC WITH DIFFERENTIAL/PLATELET  I-STAT BETA HCG BLOOD, ED ([facility], [facility], [facility] ONLY)    EKG None  Radiology No results found.  Procedures Procedures (including critical care time)  Medications Ordered in ED Medications  rabies  immune globulin (HYPERAB/KEDRAB) injection 1,500 Units (has no administration in time range)  rabies vaccine (RABAVERT) injection 1 mL (has no administration in time range)  Tdap (BOOSTRIX) injection 0.5 mL (has no administration in time range)  LORazepam (ATIVAN) injection 1 mg (has no administration in time range)     Initial Impression / Assessment and Plan / ED Course  I have reviewed the triage vital signs and the  nursing notes.  Pertinent labs & imaging results that were available during my care of the patient were reviewed by me and considered in my medical decision making (see chart for details).     23 y.o. female here with stray dog bite that occurred yesterday, now with swollen, red, hot L pinky where there is a puncture wound. Wound on the palmar aspect of the pinky, over the PIP joint. Some pain with flexion and passive extension, limited ROM due to pain and swelling, no fluctuance or drainage, slightly indurated, warm and erythematous. Concern that she may be developing flexor tenosynovitis. Will update Tdap, give rabies vaccine, and get xray, and consult hand surgery. Will reassess shortly.   5:00 PM Dr. Eulah Pont returning page, wants labs, will come see her and decide on operative management or not.   5:58 PM Dr. Eulah Pont down to see pt, will take her to the operating room for I&D. He cancelled the xray, states he didn't need it. Labs reassuring. Will give ativan for anxiety she's having regarding the procedure. Holding orders to be placed by admitting team. Please see their notes for further documentation of care. I appreciate their help with this pleasant pt's care. Pt stable at time of admission/transfer of care.    Final Clinical Impressions(s) / ED Diagnoses   Final diagnoses:  Dog bite, initial encounter  Bite wound of left hand with infection, initial encounter    ED Discharge Orders    [address] [place], [place] 03/31/18 1759    Donnetta Hutching,  MD 03/31/18 [phone]

## 2018-03-31 NOTE — ED Notes (Signed)
Bed: [facility] Expected date:  Expected time:  Means of arrival:  Comments: 

## 2018-03-31 NOTE — OR Nursing (Signed)
Ofirmev 1000 mg given IV.

## 2018-03-31 NOTE — ED Notes (Signed)
Patient transported to X-ray 

## 2018-03-31 NOTE — Anesthesia Procedure Notes (Signed)
Procedure Name: Intubation Date/Time: 03/31/2018 7:43 PM Performed by: Montel Clock, CRNA Pre-anesthesia Checklist: Patient identified, Emergency Drugs available, Suction available, Patient being monitored and Timeout performed Patient Re-evaluated:Patient Re-evaluated prior to induction Oxygen Delivery Method: Circle system utilized Preoxygenation: Pre-oxygenation with 100% oxygen Induction Type: IV induction, Rapid sequence and Cricoid Pressure applied Laryngoscope Size: Mac and 3 Grade View: Grade I Tube type: Oral Tube size: 7.0 mm Number of attempts: 1 Airway Equipment and Method: Stylet Placement Confirmation: ETT inserted through vocal cords under direct vision,  positive ETCO2 and breath sounds checked- equal and bilateral Secured at: 21 cm Tube secured with: Tape Dental Injury: Teeth and Oropharynx as per pre-operative assessment

## 2018-03-31 NOTE — ED Triage Notes (Addendum)
Patient reports bite to left hand by stray dog yesterday. Swelling noted to left fifth finger.

## 2018-03-31 NOTE — Transfer of Care (Signed)
Immediate Anesthesia Transfer of Care Note  Patient: Molly Martin  Procedure(s) Performed: IRRIGATION AND DEBRIDEMENT EXTREMITY right hand (Right )  Patient Location: PACU  Anesthesia Type:General  Level of Consciousness: drowsy and patient cooperative  Airway & Oxygen Therapy: Patient Spontanous Breathing and Patient connected to face mask oxygen  Post-op Assessment: Report given to RN and Post -op Vital signs reviewed and stable  Post vital signs: Reviewed and stable  Last Vitals:  Vitals Value Taken Time  BP    Temp    Pulse    Resp    SpO2      Last Pain:  Vitals:   03/31/18 1616  TempSrc: Oral  PainSc: 6          Complications: No apparent anesthesia complications

## 2018-03-31 NOTE — ED Notes (Addendum)
Surgery at bedside.

## 2018-03-31 NOTE — Anesthesia Postprocedure Evaluation (Signed)
Anesthesia Post Note  Patient: NAVEE PERCY  Procedure(s) Performed: IRRIGATION AND DEBRIDEMENT EXTREMITY right hand (Right )     Patient location during evaluation: PACU Anesthesia Type: General Level of consciousness: awake and alert Pain management: pain level controlled Vital Signs Assessment: post-procedure vital signs reviewed and stable Respiratory status: spontaneous breathing, nonlabored ventilation, respiratory function stable and patient connected to nasal cannula oxygen Cardiovascular status: blood pressure returned to baseline and stable Postop Assessment: no apparent nausea or vomiting Anesthetic complications: no    Last Vitals:  Vitals:   03/31/18 2115 03/31/18 2130  BP: (!) 143/82 137/77  Pulse: (!) 107 (!) 103  Resp: 13   Temp:    SpO2: 99% 100%    Last Pain:  Vitals:   03/31/18 2130  TempSrc:   PainSc: 4                  Trevor Iha

## 2018-04-01 ENCOUNTER — Encounter (HOSPITAL_COMMUNITY): Payer: Self-pay | Admitting: Orthopedic Surgery

## 2018-04-01 NOTE — Op Note (Signed)
03/31/2018  8:27 AM  PATIENT:  Molly Martin    PRE-OPERATIVE DIAGNOSIS:  left small finger injury animal bite  POST-OPERATIVE DIAGNOSIS:  Same  PROCEDURE:  IRRIGATION AND DEBRIDEMENT EXTREMITY right hand  SURGEON:  Sheral Apley, MD  ASSISTANT: Aquilla Hacker, PA-C, he was present and scrubbed throughout the case, critical for completion in a timely fashion, and for retraction, instrumentation, and closure.   ANESTHESIA:   gen  PREOPERATIVE INDICATIONS:  [name] is a  23 y.o. female with a diagnosis of left small finger injury animal bite who failed conservative measures and elected for surgical management.    The risks benefits and alternatives were discussed with the patient preoperatively including but not limited to the risks of infection, bleeding, nerve injury, cardiopulmonary complications, the need for revision surgery, among others, and the patient was willing to proceed.  OPERATIVE IMPLANTS: none  OPERATIVE FINDINGS: purulent fluid  BLOOD LOSS: min  COMPLICATIONS: none  TOURNIQUET TIME: 30 min  OPERATIVE PROCEDURE:  Patient was identified in the preoperative holding area and site was marked by me She was transported to the operating theater and placed on the table in supine position taking care to pad all bony prominences. After a preincinduction time out anesthesia was induced. The left upper extremity was prepped and draped in normal sterile fashion and a pre-incision timeout was performed. She received ancef was held until cultures sent for preoperative antibiotics.   I made a Brunelle incision over her proximal phalanx and MP joint where her redness and tenderness was on the volar aspect of her small finger.  I protected all neurovascular structures.  Purulent fluid was noted beneath the puncture wound of the dog bite.  I then spread down to her flexor tendon sheath.  I identified the A2 pulley over the proximal phalanx and protected this.  I went  just proximal made a small opening of the tendon sheath as well as distal over C1 and spread through here I delivered the flexor tendons just distal to A2 between A2 and A3.  I confirm no purulence in the tendon sheath.  No adhesions were noted.  After this tenolysis I then turned my attention to the debridement  I performed a thorough irrigation before and after tenolysis.  Any necrotic tissue was debrided with a  Raytec.   Again after another thorough irrigation I loosely closed her skin incision.  Sterile dressing was applied bulky dressing she was awoken and taken to PACU in stable condition  POST OPERATIVE PLAN: Mobilize for DVT prophylaxis p.o. antibiotics follow-up culture

## 2018-04-05 LAB — AEROBIC/ANAEROBIC CULTURE W GRAM STAIN (SURGICAL/DEEP WOUND): Culture: NO GROWTH

## 2018-06-13 LAB — OB RESULTS CONSOLE RUBELLA ANTIBODY, IGM: Rubella: IMMUNE

## 2018-06-13 LAB — OB RESULTS CONSOLE HEPATITIS B SURFACE ANTIGEN: Hepatitis B Surface Ag: NEGATIVE

## 2018-06-13 LAB — OB RESULTS CONSOLE RPR: RPR: NONREACTIVE

## 2018-06-17 ENCOUNTER — Encounter: Payer: Commercial Managed Care - PPO | Admitting: Women's Health

## 2018-09-10 ENCOUNTER — Encounter (HOSPITAL_COMMUNITY): Payer: Self-pay | Admitting: *Deleted

## 2018-09-10 ENCOUNTER — Other Ambulatory Visit: Payer: Self-pay

## 2018-09-10 ENCOUNTER — Inpatient Hospital Stay (HOSPITAL_COMMUNITY)
Admission: EM | Admit: 2018-09-10 | Discharge: 2018-09-10 | Disposition: A | Discharge status: Home / Self Care | Payer: BC Managed Care – PPO | Attending: Obstetrics and Gynecology | Admitting: Obstetrics and Gynecology

## 2018-09-10 DIAGNOSIS — Z79899 Other long term (current) drug therapy: Secondary | ICD-10-CM | POA: Diagnosis not present

## 2018-09-10 DIAGNOSIS — F419 Anxiety disorder, unspecified: Secondary | ICD-10-CM | POA: Diagnosis not present

## 2018-09-10 DIAGNOSIS — D649 Anemia, unspecified: Secondary | ICD-10-CM | POA: Insufficient documentation

## 2018-09-10 DIAGNOSIS — F329 Major depressive disorder, single episode, unspecified: Secondary | ICD-10-CM | POA: Insufficient documentation

## 2018-09-10 DIAGNOSIS — Z818 Family history of other mental and behavioral disorders: Secondary | ICD-10-CM | POA: Insufficient documentation

## 2018-09-10 DIAGNOSIS — R1031 Right lower quadrant pain: Secondary | ICD-10-CM

## 2018-09-10 DIAGNOSIS — O9989 Other specified diseases and conditions complicating pregnancy, childbirth and the puerperium: Secondary | ICD-10-CM | POA: Insufficient documentation

## 2018-09-10 DIAGNOSIS — O99012 Anemia complicating pregnancy, second trimester: Secondary | ICD-10-CM | POA: Diagnosis not present

## 2018-09-10 DIAGNOSIS — R102 Pelvic and perineal pain: Secondary | ICD-10-CM | POA: Diagnosis not present

## 2018-09-10 DIAGNOSIS — Z809 Family history of malignant neoplasm, unspecified: Secondary | ICD-10-CM | POA: Diagnosis not present

## 2018-09-10 DIAGNOSIS — O99342 Other mental disorders complicating pregnancy, second trimester: Secondary | ICD-10-CM | POA: Insufficient documentation

## 2018-09-10 DIAGNOSIS — O26892 Other specified pregnancy related conditions, second trimester: Secondary | ICD-10-CM | POA: Diagnosis not present

## 2018-09-10 DIAGNOSIS — Z3A21 21 weeks gestation of pregnancy: Secondary | ICD-10-CM | POA: Diagnosis not present

## 2018-09-10 DIAGNOSIS — N949 Unspecified condition associated with female genital organs and menstrual cycle: Secondary | ICD-10-CM

## 2018-09-10 LAB — COMPREHENSIVE METABOLIC PANEL
ALT: 15 U/L (ref 0–44)
AST: 18 U/L (ref 15–41)
Albumin: 3.1 g/dL — ABNORMAL LOW (ref 3.5–5.0)
Alkaline Phosphatase: 75 U/L (ref 38–126)
Anion gap: 8 (ref 5–15)
BUN: 5 mg/dL — ABNORMAL LOW (ref 6–20)
CO2: 24 mmol/L (ref 22–32)
Calcium: 9.4 mg/dL (ref 8.9–10.3)
Chloride: 105 mmol/L (ref 98–111)
Creatinine, Ser: 0.43 mg/dL — ABNORMAL LOW (ref 0.44–1.00)
GFR calc Af Amer: >60 mL/min (ref >60)
GFR calc non Af Amer: >60 mL/min (ref >60)
Glucose, Bld: 83 mg/dL (ref 70–99)
Potassium: 3.8 mmol/L (ref 3.5–5.1)
Sodium: 137 mmol/L (ref 135–145)
Total Bilirubin: 0.3 mg/dL (ref 0.3–1.2)
Total Protein: 5.5 g/dL — ABNORMAL LOW (ref 6.5–8.1)

## 2018-09-10 LAB — URINALYSIS, ROUTINE W REFLEX MICROSCOPIC
Bilirubin Urine: NEGATIVE
Glucose, UA: NEGATIVE mg/dL
Hgb urine dipstick: NEGATIVE
Ketones, ur: NEGATIVE mg/dL
Nitrite: NEGATIVE
Protein, ur: NEGATIVE mg/dL
Specific Gravity, Urine: 1.014 (ref 1.005–1.030)
pH: 8 (ref 5.0–8.0)

## 2018-09-10 LAB — CBC WITH DIFFERENTIAL/PLATELET
Abs Immature Granulocytes: 0.05 10*3/uL (ref 0.00–0.07)
Basophils Absolute: 0 10*3/uL (ref 0.0–0.1)
Basophils Relative: 0 %
Eosinophils Absolute: 0.1 10*3/uL (ref 0.0–0.5)
Eosinophils Relative: 1 %
HCT: 34.5 % — ABNORMAL LOW (ref 36.0–46.0)
Hemoglobin: 11.9 g/dL — ABNORMAL LOW (ref 12.0–15.0)
Immature Granulocytes: 1 %
Lymphocytes Relative: 16 %
Lymphs Abs: 1.5 10*3/uL (ref 0.7–4.0)
MCH: 29.3 pg (ref 26.0–34.0)
MCHC: 34.5 g/dL (ref 30.0–36.0)
MCV: 85 fL (ref 80.0–100.0)
Monocytes Absolute: 0.7 10*3/uL (ref 0.1–1.0)
Monocytes Relative: 7 %
Neutro Abs: 7.1 10*3/uL (ref 1.7–7.7)
Neutrophils Relative %: 75 %
Platelets: 223 10*3/uL (ref 150–400)
RBC: 4.06 MIL/uL (ref 3.87–5.11)
RDW: 13.2 % (ref 11.5–15.5)
WBC: 9.5 10*3/uL (ref 4.0–10.5)
nRBC: 0 % (ref 0.0–0.2)

## 2018-09-10 LAB — WET PREP, GENITAL
Clue Cells Wet Prep HPF POC: NONE SEEN
Sperm: NONE SEEN
Trich, Wet Prep: NONE SEEN
Yeast Wet Prep HPF POC: NONE SEEN

## 2018-09-10 LAB — LIPASE, BLOOD: Lipase: 21 U/L (ref 11–51)

## 2018-09-10 MED ORDER — IBUPROFEN 600 MG PO TABS
600.0000 mg | ORAL_TABLET | Freq: Once | ORAL | Status: AC
  Administered 2018-09-10: 600 mg via ORAL
  Filled 2018-09-10: qty 1

## 2018-09-10 MED ORDER — IBUPROFEN 600 MG PO TABS: 600.0000 mg | ORAL_TABLET | Freq: Four times a day (QID) | ORAL | 0 refills | Status: DC | PRN

## 2018-09-10 NOTE — Discharge Instructions (Signed)

## 2018-09-10 NOTE — ED Notes (Signed)
Called to report to [facility] transport.

## 2018-09-10 NOTE — MAU Provider Note (Signed)
History     CSN: [id number]  Arrival date and time: 09/10/18 1225   First Provider Initiated Contact with Patient 09/10/18 1333      Chief Complaint  Patient presents with  . Abdominal Pain   G1 @21 .4 wks presenting with acute onset RLQ pain. Pain onset 3-5 am today. Describes as sharp and intermittent, radiates to rt flank and back. Pain is worse with standing, movement, coughing, and deep breaths. Rates 10/10. Took Tylenol around 8am with no relief. Denies heavy lifting, pushing, pulling, or falls. Denies urinary sx. No GI sx. No fevers. Denies VB or cramping. Reports thick white clumpy vaginal discharge. No itching. Her pregnancy is complicated by +Ab screen.  OB History    Gravida  1   Para  0   Term  0   Preterm  0   AB  0   Living  0     SAB  0   TAB  0   Ectopic  0   Multiple  0   Live Births  0           Past Medical History:  Diagnosis Date  . Anemia   . Anxiety   . Depression     Past Surgical History:  Procedure Laterality Date  . I&D EXTREMITY Right 03/31/2018   Procedure: IRRIGATION AND DEBRIDEMENT EXTREMITY right hand;  Surgeon: Sheral ApleyMurphy, Timothy D, MD;  Location: [facility];  Service: Orthopedics;  Laterality: Right;  . TONSILLECTOMY      Family History  Problem Relation Age of Onset  . Bipolar disorder Mother   . Hypertension Father   . Depression Sister   . Stroke Maternal Grandmother   . Cancer Maternal Grandfather   . Anxiety disorder Paternal Grandmother     Social History   Tobacco Use  . Smoking status: Never Smoker  . Smokeless tobacco: Never Used  Substance Use Topics  . Alcohol use: No    Frequency: Never  . Drug use: No    Allergies: No Known Allergies  No medications prior to admission.    Review of Systems  Constitutional: Negative for chills and fever.  Gastrointestinal: Positive for abdominal pain. Negative for constipation, diarrhea, nausea and vomiting.  Genitourinary: Positive for flank pain and vaginal  discharge. Negative for dysuria, frequency, urgency and vaginal bleeding.  Musculoskeletal: Negative for back pain.   Physical Exam   Blood pressure (!) 113/57, pulse 69, temperature 98.7 F (37.1 C), temperature source Oral, resp. rate 19, weight 83.3 kg, last menstrual period 03/03/2018, SpO2 96 %.  Physical Exam  Nursing note and vitals reviewed. Constitutional: She is oriented to person, place, and time. She appears well-developed and well-nourished. No distress.  HENT:  Head: Normocephalic and atraumatic.  Neck: Normal range of motion.  Cardiovascular: Normal rate.  Respiratory: Effort normal. No respiratory distress.  GI: Soft. She exhibits no distension and no mass. There is abdominal tenderness in the right lower quadrant. There is no rebound, no guarding and no CVA tenderness.  Genitourinary:    Genitourinary Comments: External: no lesions or erythema Vagina: rugated, pink, moist, thick light yellow mucous discharge Cervix closed/long    Musculoskeletal: Normal range of motion.     Cervical back: Normal.     Thoracic back: Normal.     Lumbar back: Normal.  Neurological: She is alert and oriented to person, place, and time.  Skin: Skin is warm and dry.  Psychiatric: She has a normal mood and affect.  FHT 151  Results  for orders placed or performed during the hospital encounter of 09/10/18 (from the past 24 hour(s))  Urinalysis, Routine w reflex microscopic     Status: Abnormal   Collection Time: 09/10/18  1:13 PM  Result Value Ref Range   Color, Urine YELLOW YELLOW   APPearance CLOUDY (A) CLEAR   Specific Gravity, Urine 1.014 1.005 - 1.030   pH 8.0 5.0 - 8.0   Glucose, UA NEGATIVE NEGATIVE mg/dL   Hgb urine dipstick NEGATIVE NEGATIVE   Bilirubin Urine NEGATIVE NEGATIVE   Ketones, ur NEGATIVE NEGATIVE mg/dL   Protein, ur NEGATIVE NEGATIVE mg/dL   Nitrite NEGATIVE NEGATIVE   Leukocytes,Ua LARGE (A) NEGATIVE   RBC / HPF 0-5 0 - 5 RBC/hpf   WBC, UA 6-10 0 - 5  WBC/hpf   Bacteria, UA FEW (A) NONE SEEN   Squamous Epithelial / LPF 0-5 0 - 5   Mucus PRESENT    Amorphous Crystal PRESENT   Wet prep, genital     Status: Abnormal   Collection Time: 09/10/18  1:44 PM  Result Value Ref Range   Yeast Wet Prep HPF POC NONE SEEN NONE SEEN   Trich, Wet Prep NONE SEEN NONE SEEN   Clue Cells Wet Prep HPF POC NONE SEEN NONE SEEN   WBC, Wet Prep HPF POC MANY (A) NONE SEEN   Sperm NONE SEEN   CBC with Differential/Platelet     Status: Abnormal   Collection Time: 09/10/18  1:58 PM  Result Value Ref Range   WBC 9.5 4.0 - 10.5 K/uL   RBC 4.06 3.87 - 5.11 MIL/uL   Hemoglobin 11.9 (L) 12.0 - 15.0 g/dL   HCT 16.134.5 (L) 09.636.0 - 04.546.0 %   MCV 85.0 80.0 - 100.0 fL   MCH 29.3 26.0 - 34.0 pg   MCHC 34.5 30.0 - 36.0 g/dL   RDW 40.913.2 81.111.5 - 91.415.5 %   Platelets 223 150 - 400 K/uL   nRBC 0.0 0.0 - 0.2 %   Neutrophils Relative % 75 %   Neutro Abs 7.1 1.7 - 7.7 K/uL   Lymphocytes Relative 16 %   Lymphs Abs 1.5 0.7 - 4.0 K/uL   Monocytes Relative 7 %   Monocytes Absolute 0.7 0.1 - 1.0 K/uL   Eosinophils Relative 1 %   Eosinophils Absolute 0.1 0.0 - 0.5 K/uL   Basophils Relative 0 %   Basophils Absolute 0.0 0.0 - 0.1 K/uL   Immature Granulocytes 1 %   Abs Immature Granulocytes 0.05 0.00 - 0.07 K/uL  Comprehensive metabolic panel     Status: Abnormal   Collection Time: 09/10/18  1:58 PM  Result Value Ref Range   Sodium 137 135 - 145 mmol/L   Potassium 3.8 3.5 - 5.1 mmol/L   Chloride 105 98 - 111 mmol/L   CO2 24 22 - 32 mmol/L   Glucose, Bld 83 70 - 99 mg/dL   BUN 5 (L) 6 - 20 mg/dL   Creatinine, Ser 7.820.43 (L) 0.44 - 1.00 mg/dL   Calcium 9.4 8.9 - 95.610.3 mg/dL   Total Protein 5.5 (L) 6.5 - 8.1 g/dL   Albumin 3.1 (L) 3.5 - 5.0 g/dL   AST 18 15 - 41 U/L   ALT 15 0 - 44 U/L   Alkaline Phosphatase 75 38 - 126 U/L   Total Bilirubin 0.3 0.3 - 1.2 mg/dL   GFR calc non Af Amer >60 >60 mL/min   GFR calc Af Amer >60 >60 mL/min   Anion gap 8  5 - 15  Lipase, blood      Status: None   Collection Time: 09/10/18  1:58 PM  Result Value Ref Range   Lipase 21 11 - 51 U/L   MAU Course  Procedures Orders Placed This Encounter  Procedures  . Culture, OB Urine    Standing Status:   Standing    Number of Occurrences:   1  . Wet prep, genital    Standing Status:   Standing    Number of Occurrences:   1    Order Specific Question:   Patient immune status    Answer:   Normal  . Urinalysis, Routine w reflex microscopic    Standing Status:   Standing    Number of Occurrences:   1  . CBC with Differential/Platelet    Standing Status:   Standing    Number of Occurrences:   1  . Comprehensive metabolic panel    Standing Status:   Standing    Number of Occurrences:   1  . Lipase, blood    Standing Status:   Standing    Number of Occurrences:   1  . Discharge patient    Order Specific Question:   Discharge disposition    Answer:   01-Home or Self Care [1]    Order Specific Question:   Discharge patient date    Answer:   09/10/2018   MDM Labs ordered and reviewed. UA with leuks, WBCs, and bacteria; low suspicion for UTI, will send UC. GC pending. Pain improved. Labs normal. No evidence of acute abd process or PTL. Pain likely MSK/RL. Recommend short course of Ibuprofen and heat. Stable for discharge home.  Assessment and Plan   1. [redacted] weeks gestation of pregnancy   2. Round ligament pain    Discharge home Follow up at [facility] as scheduled Return for worsening sx Rx Ibuprofen  Allergies as of 09/10/2018   No Known Allergies     Medication List    STOP taking these medications   HYDROcodone-acetaminophen 5-325 MG tablet Commonly known as: Norco   norgestimate-ethinyl estradiol 0.25-35 MG-MCG tablet Commonly known as: ORTHO-CYCLEN   ondansetron 4 MG tablet Commonly known as: Zofran     TAKE these medications   BUSPAR PO Take by mouth.   chromagen capsule Take 1 capsule by mouth daily. Taking 65mg  daily   escitalopram 10 MG  tablet Commonly known as: LEXAPRO Take 10 mg by mouth daily.   ibuprofen 600 MG tablet Commonly known as: ADVIL Take 1 tablet (600 mg total) by mouth every 6 (six) hours as needed for moderate pain. For pain / inflammation. What changed: when to take this   prenatal vitamin w/FE, FA 27-1 MG Tabs tablet Take 1 tablet by mouth daily at 12 noon.      Julianne Handler, CNM 09/10/2018, 3:36 PM

## 2018-09-10 NOTE — MAU Note (Signed)
Sharp pain in RLQ, woke her during the night. Has gotten worse.  Pain goes through to back. Comes and goes in waves.   Denies fever, diarrhea or urinary problems.

## 2018-09-11 LAB — CULTURE, OB URINE: Culture: NO GROWTH

## 2018-09-11 LAB — GC/CHLAMYDIA PROBE AMP (~~LOC~~) NOT AT ARMC
Chlamydia: NEGATIVE
Neisseria Gonorrhea: NEGATIVE

## 2018-10-14 LAB — OB RESULTS CONSOLE HIV ANTIBODY (ROUTINE TESTING): HIV: NONREACTIVE

## 2018-11-25 ENCOUNTER — Encounter (HOSPITAL_COMMUNITY): Payer: Self-pay

## 2018-11-25 ENCOUNTER — Inpatient Hospital Stay (HOSPITAL_COMMUNITY)
Admission: AD | Admit: 2018-11-25 | Discharge: 2018-11-25 | Disposition: A | Discharge status: Home / Self Care | Payer: BC Managed Care – PPO | Attending: Obstetrics and Gynecology | Admitting: Obstetrics and Gynecology

## 2018-11-25 ENCOUNTER — Other Ambulatory Visit: Payer: Self-pay

## 2018-11-25 DIAGNOSIS — Z3A32 32 weeks gestation of pregnancy: Secondary | ICD-10-CM | POA: Diagnosis not present

## 2018-11-25 DIAGNOSIS — N930 Postcoital and contact bleeding: Secondary | ICD-10-CM

## 2018-11-25 DIAGNOSIS — O26899 Other specified pregnancy related conditions, unspecified trimester: Secondary | ICD-10-CM

## 2018-11-25 DIAGNOSIS — O1203 Gestational edema, third trimester: Secondary | ICD-10-CM | POA: Diagnosis not present

## 2018-11-25 DIAGNOSIS — O26893 Other specified pregnancy related conditions, third trimester: Secondary | ICD-10-CM | POA: Diagnosis not present

## 2018-11-25 DIAGNOSIS — O99343 Other mental disorders complicating pregnancy, third trimester: Secondary | ICD-10-CM | POA: Diagnosis not present

## 2018-11-25 DIAGNOSIS — F419 Anxiety disorder, unspecified: Secondary | ICD-10-CM | POA: Diagnosis not present

## 2018-11-25 DIAGNOSIS — R102 Pelvic and perineal pain: Secondary | ICD-10-CM | POA: Insufficient documentation

## 2018-11-25 DIAGNOSIS — F329 Major depressive disorder, single episode, unspecified: Secondary | ICD-10-CM | POA: Insufficient documentation

## 2018-11-25 DIAGNOSIS — R1030 Lower abdominal pain, unspecified: Secondary | ICD-10-CM | POA: Insufficient documentation

## 2018-11-25 DIAGNOSIS — R030 Elevated blood-pressure reading, without diagnosis of hypertension: Secondary | ICD-10-CM | POA: Insufficient documentation

## 2018-11-25 DIAGNOSIS — O4693 Antepartum hemorrhage, unspecified, third trimester: Secondary | ICD-10-CM | POA: Diagnosis present

## 2018-11-25 DIAGNOSIS — Z79899 Other long term (current) drug therapy: Secondary | ICD-10-CM | POA: Insufficient documentation

## 2018-11-25 LAB — COMPREHENSIVE METABOLIC PANEL
ALT: 13 U/L (ref 0–44)
AST: 19 U/L (ref 15–41)
Albumin: 2.9 g/dL — ABNORMAL LOW (ref 3.5–5.0)
Alkaline Phosphatase: 119 U/L (ref 38–126)
Anion gap: 8 (ref 5–15)
BUN: <5 mg/dL — ABNORMAL LOW (ref 6–20)
CO2: 25 mmol/L (ref 22–32)
Calcium: 8.8 mg/dL — ABNORMAL LOW (ref 8.9–10.3)
Chloride: 104 mmol/L (ref 98–111)
Creatinine, Ser: 0.64 mg/dL (ref 0.44–1.00)
GFR calc Af Amer: >60 mL/min (ref >60)
GFR calc non Af Amer: >60 mL/min (ref >60)
Glucose, Bld: 86 mg/dL (ref 70–99)
Potassium: 2.8 mmol/L — ABNORMAL LOW (ref 3.5–5.1)
Sodium: 137 mmol/L (ref 135–145)
Total Bilirubin: 0.5 mg/dL (ref 0.3–1.2)
Total Protein: 5.7 g/dL — ABNORMAL LOW (ref 6.5–8.1)

## 2018-11-25 LAB — PROTEIN / CREATININE RATIO, URINE
Creatinine, Urine: 23.75 mg/dL
Total Protein, Urine: <6 mg/dL

## 2018-11-25 LAB — URINALYSIS, ROUTINE W REFLEX MICROSCOPIC
Bilirubin Urine: NEGATIVE
Glucose, UA: NEGATIVE mg/dL
Ketones, ur: NEGATIVE mg/dL
Nitrite: NEGATIVE
Protein, ur: NEGATIVE mg/dL
Specific Gravity, Urine: 1.003 — ABNORMAL LOW (ref 1.005–1.030)
pH: 7 (ref 5.0–8.0)

## 2018-11-25 LAB — CBC
HCT: 33.1 % — ABNORMAL LOW (ref 36.0–46.0)
Hemoglobin: 11.6 g/dL — ABNORMAL LOW (ref 12.0–15.0)
MCH: 30.1 pg (ref 26.0–34.0)
MCHC: 35 g/dL (ref 30.0–36.0)
MCV: 85.8 fL (ref 80.0–100.0)
Platelets: 239 10*3/uL (ref 150–400)
RBC: 3.86 MIL/uL — ABNORMAL LOW (ref 3.87–5.11)
RDW: 12.5 % (ref 11.5–15.5)
WBC: 11.1 10*3/uL — ABNORMAL HIGH (ref 4.0–10.5)
nRBC: 0 % (ref 0.0–0.2)

## 2018-11-25 MED ORDER — COMFORT FIT MATERNITY SUPP LG MISC: 1.0000 [IU] | Freq: Every day | 0 refills | Status: DC

## 2018-11-25 MED ORDER — ACETAMINOPHEN 325 MG PO TABS
650.0000 mg | ORAL_TABLET | Freq: Once | ORAL | Status: AC
  Administered 2018-11-25: 650 mg via ORAL
  Filled 2018-11-25: qty 2

## 2018-11-25 NOTE — MAU Note (Signed)
Pt here with c/o abdominal pain and bleeding that started last night after intercourse. Denies any leaking. Reports some decreased fetal movement.

## 2018-11-25 NOTE — MAU Provider Note (Signed)
History     CSN: [id number]  Arrival date and time: 11/25/18 2011   First Provider Initiated Contact with Patient 11/25/18 2109      Chief Complaint  Patient presents with  . Vaginal Bleeding  . Abdominal Pain   Molly Martin is a 23 y.o. G1P0 at 6820w3d who presents to MAU with complaints of abdominal pain and vaginal bleeding. She reports vaginal bleeding started last night after intercourse, describes vaginal bleeding as a mix of dark brown bleeding and bright red bleeding. She denies clots she denies having to wear a pad, reports wearing a panty liner and noticed staining on the liner. She reports abdominal pain started occurring this evening around 1500, describes lower abdominal pain as "someone gutting me" that gets worse with standing and movement. Rates pain 7/10- has not taken any medication today for pain. Reports taking Tylenol last night for her round ligament pain and the pain resolved. She denies contractions or tightening. Patient is concerned over weight gain in the past 1.5 weeks, reports gaining 10 pounds. Denies complications during pregnancy with hypertension, denies HA, vision changes or RUQ pain.    OB History    Gravida  1   Para  0   Term  0   Preterm  0   AB  0   Living  0     SAB  0   TAB  0   Ectopic  0   Multiple  0   Live Births  0           Past Medical History:  Diagnosis Date  . Anemia   . Anxiety   . Depression     Past Surgical History:  Procedure Laterality Date  . I&D EXTREMITY Right 03/31/2018   Procedure: IRRIGATION AND DEBRIDEMENT EXTREMITY right hand;  Surgeon: Sheral ApleyMurphy, Timothy D, MD;  Location: [facility];  Service: Orthopedics;  Laterality: Right;  . TONSILLECTOMY      Family History  Problem Relation Age of Onset  . Bipolar disorder Mother   . Hypertension Father   . Depression Sister   . Stroke Maternal Grandmother   . Cancer Maternal Grandfather   . Anxiety disorder Paternal Grandmother     Social History    Tobacco Use  . Smoking status: Never Smoker  . Smokeless tobacco: Never Used  Substance Use Topics  . Alcohol use: No    Frequency: Never  . Drug use: No    Allergies: No Known Allergies  Medications Prior to Admission  Medication Sig Dispense Refill Last Dose  . escitalopram (LEXAPRO) 10 MG tablet Take 10 mg by mouth daily.   11/25/2018 at Unknown time  . Iron Combinations (CHROMAGEN) capsule Take 1 capsule by mouth daily. Taking 65mg  daily   11/25/2018 at Unknown time  . prenatal vitamin w/FE, FA (PRENATAL 1 + 1) 27-1 MG TABS tablet Take 1 tablet by mouth daily at 12 noon.   11/25/2018 at Unknown time  . busPIRone HCl (BUSPAR PO) Take by mouth.     Marland Kitchen. ibuprofen (ADVIL) 600 MG tablet Take 1 tablet (600 mg total) by mouth every 6 (six) hours as needed for moderate pain. For pain / inflammation. 8 tablet 0     Review of Systems  Constitutional: Negative.   Respiratory: Negative.   Cardiovascular: Negative.   Gastrointestinal: Positive for abdominal pain. Negative for constipation, diarrhea, nausea and vomiting.  Genitourinary: Positive for vaginal bleeding. Negative for difficulty urinating, dysuria, frequency, hematuria, pelvic pain and urgency.  Musculoskeletal: Negative.   Neurological: Negative.    Physical Exam   Patient Vitals for the past 24 hrs:  BP Temp Temp src Pulse Resp SpO2 Height Weight  11/25/18 2302 135/66 - - 62 17 99 % - -  11/25/18 2216 133/84 - - 67 - - - -  11/25/18 2201 132/80 - - 63 - - - -  11/25/18 2146 135/72 - - 68 - - - -  11/25/18 2131 136/82 - - 72 - - - -  11/25/18 2129 138/90 - - 73 - - - -  11/25/18 2054 138/81 97.9 F (36.6 C) Oral 79 - - - -  11/25/18 2053 138/81 - - 78 - - - -  11/25/18 2032 (!) 147/85 98.4 F (36.9 C) Oral 70 18 100 % 5\' 4"  (1.626 m) 89.4 kg    Physical Exam  Nursing note and vitals reviewed. Constitutional: She is oriented to person, place, and time. She appears well-developed and well-nourished.  Cardiovascular:  Normal rate and regular rhythm.  Respiratory: Effort normal. No respiratory distress. She has no wheezes.  GI: Soft.  Gravid appropriate for gestational age, no RUQ pain or tenderness, no contractions palpated   Genitourinary:    Genitourinary Comments: Pelvic exam: Cervix pink, visually closed- cervix friable with speculum, scant white creamy discharge- no active vaginal bleeding, none on pad, vaginal walls and external genitalia normal   Musculoskeletal: Normal range of motion.        General: Edema present.     Comments: +2 pitting bilateral edema   Neurological: She is alert and oriented to person, place, and time. She displays normal reflexes. She exhibits normal muscle tone.  Skin: Skin is warm and dry.  Psychiatric: She has a normal mood and affect. Her behavior is normal. Thought content normal.   Dilation: Closed Effacement (%): Thick Cervical Position: Posterior Presentation: Vertex Exam by:: Wende Bushy CNM  MAU Course  Procedures  MDM Anterior placenta based on chart review, no placental previa  Vaginal bleeding after intercourse- encouraged pelvic rest for 7 days after bleeding   PEC labs ordered based on elevated BP in triage, 10+ pound weight gain and +2 pitting edema- patient denies HA, vision changes, or RUQ pain.   Tylenol ordered for abdominal pain.  Labs results reviewed:  Results for orders placed or performed during the hospital encounter of 11/25/18 (from the past 24 hour(s))  CBC     Status: Abnormal   Collection Time: 11/25/18  9:34 PM  Result Value Ref Range   WBC 11.1 (H) 4.0 - 10.5 K/uL   RBC 3.86 (L) 3.87 - 5.11 MIL/uL   Hemoglobin 11.6 (L) 12.0 - 15.0 g/dL   HCT 33.1 (L) 36.0 - 46.0 %   MCV 85.8 80.0 - 100.0 fL   MCH 30.1 26.0 - 34.0 pg   MCHC 35.0 30.0 - 36.0 g/dL   RDW 12.5 11.5 - 15.5 %   Platelets 239 150 - 400 K/uL   nRBC 0.0 0.0 - 0.2 %  Comprehensive metabolic panel     Status: Abnormal   Collection Time: 11/25/18  9:34 PM  Result Value  Ref Range   Sodium 137 135 - 145 mmol/L   Potassium 2.8 (L) 3.5 - 5.1 mmol/L   Chloride 104 98 - 111 mmol/L   CO2 25 22 - 32 mmol/L   Glucose, Bld 86 70 - 99 mg/dL   BUN <5 (L) 6 - 20 mg/dL   Creatinine, Ser 0.64 0.44 - 1.00 mg/dL  Calcium 8.8 (L) 8.9 - 10.3 mg/dL   Total Protein 5.7 (L) 6.5 - 8.1 g/dL   Albumin 2.9 (L) 3.5 - 5.0 g/dL   AST 19 15 - 41 U/L   ALT 13 0 - 44 U/L   Alkaline Phosphatase 119 38 - 126 U/L   Total Bilirubin 0.5 0.3 - 1.2 mg/dL   GFR calc non Af Amer >60 >60 mL/min   GFR calc Af Amer >60 >60 mL/min   Anion gap 8 5 - 15  Protein / creatinine ratio, urine     Status: None   Collection Time: 11/25/18  9:38 PM  Result Value Ref Range   Creatinine, Urine 23.75 mg/dL   Total Protein, Urine <6 mg/dL   Protein Creatinine Ratio        0.00 - 0.15 mg/mg[Cre]  Urinalysis, Routine w reflex microscopic     Status: Abnormal   Collection Time: 11/25/18  9:38 PM  Result Value Ref Range   Color, Urine STRAW (A) YELLOW   APPearance CLEAR CLEAR   Specific Gravity, Urine 1.003 (L) 1.005 - 1.030   pH 7.0 5.0 - 8.0   Glucose, UA NEGATIVE NEGATIVE mg/dL   Hgb urine dipstick SMALL (A) NEGATIVE   Bilirubin Urine NEGATIVE NEGATIVE   Ketones, ur NEGATIVE NEGATIVE mg/dL   Protein, ur NEGATIVE NEGATIVE mg/dL   Nitrite NEGATIVE NEGATIVE   Leukocytes,Ua TRACE (A) NEGATIVE   RBC / HPF 0-5 0 - 5 RBC/hpf   WBC, UA 0-5 0 - 5 WBC/hpf   Bacteria, UA RARE (A) NONE SEEN   Squamous Epithelial / LPF 0-5 0 - 5    PEC labs negative  Upon reassessment after labs, patient reports abdominal pain has resolved with tylenol.  No vaginal bleeding on pad or when patient wiped  Educated and discussed reasons to return to MAU, encouraged pelvic rest for 7 days d/t vaginal bleeding after intercourse. Encouraged to drink 6-8 bottles of water per day and to elevate feet to level of breast at home. Follow up as scheduled in the office for prenatal care. Pt discharged home in stable condition.    Assessment and Plan   1. PCB (post coital bleeding)   2. Pain of round ligament during pregnancy   3. Elevated BP without diagnosis of hypertension   4. Edema during pregnancy in third trimester    Discharge home Rx for maternity support belt  Follow up as scheduled in the office for prenatal care Return to MAU as needed for reasons discussed and/or emergencies  Pelvic rest x7 days, hydration and elevate extremities  Follow-up Information    [facility] Follow up.   Why: Go here to pick up maternity support belt  Contact information: [address]  [place] [address]       Ginette Otto, [facility] Follow up.   Why: Follow up as scheduled for prenatal care Contact information: [address] [place] [address] [phone]           MARGREAT MCCLOSKEY CNM 11/25/2018, 11:28 PM

## 2018-12-04 ENCOUNTER — Inpatient Hospital Stay (HOSPITAL_COMMUNITY)
Admission: AD | Admit: 2018-12-04 | Discharge: 2018-12-05 | Disposition: A | Discharge status: Home / Self Care | Payer: BC Managed Care – PPO | Source: Ambulatory Visit | Attending: Obstetrics & Gynecology | Admitting: Obstetrics & Gynecology

## 2018-12-04 ENCOUNTER — Encounter (HOSPITAL_COMMUNITY): Payer: Self-pay

## 2018-12-04 ENCOUNTER — Other Ambulatory Visit: Payer: Self-pay

## 2018-12-04 DIAGNOSIS — R109 Unspecified abdominal pain: Secondary | ICD-10-CM

## 2018-12-04 DIAGNOSIS — M545 Low back pain: Secondary | ICD-10-CM | POA: Diagnosis not present

## 2018-12-04 DIAGNOSIS — Z3A33 33 weeks gestation of pregnancy: Secondary | ICD-10-CM | POA: Diagnosis not present

## 2018-12-04 DIAGNOSIS — E876 Hypokalemia: Secondary | ICD-10-CM | POA: Insufficient documentation

## 2018-12-04 DIAGNOSIS — O133 Gestational [pregnancy-induced] hypertension without significant proteinuria, third trimester: Secondary | ICD-10-CM

## 2018-12-04 DIAGNOSIS — R1030 Lower abdominal pain, unspecified: Secondary | ICD-10-CM | POA: Insufficient documentation

## 2018-12-04 DIAGNOSIS — O26893 Other specified pregnancy related conditions, third trimester: Secondary | ICD-10-CM

## 2018-12-04 DIAGNOSIS — R82998 Other abnormal findings in urine: Secondary | ICD-10-CM

## 2018-12-04 DIAGNOSIS — O99283 Endocrine, nutritional and metabolic diseases complicating pregnancy, third trimester: Secondary | ICD-10-CM | POA: Diagnosis not present

## 2018-12-04 DIAGNOSIS — R300 Dysuria: Secondary | ICD-10-CM

## 2018-12-04 DIAGNOSIS — O26899 Other specified pregnancy related conditions, unspecified trimester: Secondary | ICD-10-CM

## 2018-12-04 DIAGNOSIS — O368390 Maternal care for abnormalities of the fetal heart rate or rhythm, unspecified trimester, not applicable or unspecified: Secondary | ICD-10-CM

## 2018-12-04 LAB — URINALYSIS, ROUTINE W REFLEX MICROSCOPIC
Bilirubin Urine: NEGATIVE
Glucose, UA: NEGATIVE mg/dL
Hgb urine dipstick: NEGATIVE
Ketones, ur: NEGATIVE mg/dL
Nitrite: NEGATIVE
Protein, ur: NEGATIVE mg/dL
Specific Gravity, Urine: 1.003 — ABNORMAL LOW (ref 1.005–1.030)
pH: 8 (ref 5.0–8.0)

## 2018-12-04 MED ORDER — ACETAMINOPHEN 325 MG PO TABS
650.0000 mg | ORAL_TABLET | Freq: Four times a day (QID) | ORAL | Status: DC | PRN
  Administered 2018-12-05: 650 mg via ORAL
  Filled 2018-12-04: qty 2

## 2018-12-04 NOTE — MAU Provider Note (Signed)
Chief Complaint:  Abdominal Pain and Dysuria   First Provider Initiated Contact with Patient 12/04/18 2308     HPI: Molly Martin is a 23 y.o. G1P0000 at 25w5dwho presents to maternity admissions reporting lower abdominal pain and low back pain.  Has more pain when she urinates.  No bleeding or leaking  Reports pedal edema recently.. She reports good fetal movement, denies LOF, vaginal bleeding, vaginal itching/burning, urinary symptoms, h/a, dizziness, n/v, diarrhea, constipation or fever/chills.  She denies headache, visual changes or RUQ abdominal pain.  RN note: Presents with lower abd pain; alternation from sharp, shooting to a dull ache starting approx two hours ago. No LOF or vag bldg. C/o dysuria  Past Medical History: Past Medical History:  Diagnosis Date  . Anemia   . Anxiety   . Depression     Past obstetric history: OB History  Gravida Para Term Preterm AB Living  1 0 0 0 0 0  SAB TAB Ectopic Multiple Live Births  0 0 0 0 0    # Outcome Date GA Lbr Len/2nd Weight Sex Delivery Anes PTL Lv  1 Current             Past Surgical History: Past Surgical History:  Procedure Laterality Date  . I&D EXTREMITY Right 03/31/2018   Procedure: IRRIGATION AND DEBRIDEMENT EXTREMITY right hand;  Surgeon: Renette Butters, MD;  Location: [facility];  Service: Orthopedics;  Laterality: Right;  . TONSILLECTOMY      Family History: Family History  Problem Relation Age of Onset  . Bipolar disorder Mother   . Hypertension Father   . Depression Sister   . Stroke Maternal Grandmother   . Cancer Maternal Grandfather   . Anxiety disorder Paternal Grandmother     Social History: Social History   Tobacco Use  . Smoking status: Never Smoker  . Smokeless tobacco: Never Used  Substance Use Topics  . Alcohol use: No    Frequency: Never  . Drug use: No    Allergies: No Known Allergies  Meds:  Medications Prior to Admission  Medication Sig Dispense Refill Last Dose  . busPIRone  HCl (BUSPAR PO) Take by mouth.     . Elastic Bandages & Supports (COMFORT FIT MATERNITY SUPP LG) MISC 1 Units by Does not apply route daily. 1 each 0   . escitalopram (LEXAPRO) 10 MG tablet Take 10 mg by mouth daily.     . Iron Combinations (CHROMAGEN) capsule Take 1 capsule by mouth daily. Taking 65mg  daily     . prenatal vitamin w/FE, FA (PRENATAL 1 + 1) 27-1 MG TABS tablet Take 1 tablet by mouth daily at 12 noon.       I have reviewed patient's Past Medical Hx, Surgical Hx, Family Hx, Social Hx, medications and allergies.   ROS:  Review of Systems  Constitutional: Negative for chills and fever.  Eyes: Negative for visual disturbance.  Respiratory: Negative for shortness of breath.   Cardiovascular: Positive for leg swelling.  Gastrointestinal: Positive for abdominal pain. Negative for constipation, diarrhea, nausea and vomiting.  Genitourinary: Positive for dysuria and pelvic pain. Negative for vaginal bleeding.  Musculoskeletal: Positive for back pain.  Neurological: Negative for headaches.   Other systems negative  Physical Exam   Patient Vitals for the past 24 hrs:  BP Temp Temp src Pulse Resp SpO2  12/04/18 2300 (!) 146/90 98 F (36.7 C) Oral 71 18 100 %   Vitals:   12/04/18 2316 12/04/18 2331 12/04/18 2345 12/05/18  0002  BP: 140/86 (!) 138/91 128/70 134/90  Pulse: 69 63 65 79  Resp:      Temp:      TempSrc:      SpO2:        Constitutional: Well-developed, well-nourished female in no acute distress.  Cardiovascular: normal rate and rhythm Respiratory: normal effort, clear to auscultation bilaterally GI: Abd soft, non-tender, gravid appropriate for gestational age.   No rebound or guarding. MS: Extremities nontender, 1+ pedal and pretibial edema, normal ROM Neurologic: Alert and oriented x 4.   GU: Neg CVAT.  PELVIC EXAM: Cervix pink, visually closed, without lesion, scant white creamy discharge, vaginal walls and external genitalia normal   Dilation:  Closed Effacement (%): Thick Cervical Position: Posterior Station: Web designer, -3 Presentation: Vertex Exam by:: Wynelle Bourgeois CNM  FHT:  Baseline 135 , moderate variability, accelerations present, no decelerations Contractions:  Rare   Labs: Results for orders placed or performed during the hospital encounter of 12/04/18 (from the past 24 hour(s))  Urinalysis, Routine w reflex microscopic     Status: Abnormal   Collection Time: 12/04/18 11:12 PM  Result Value Ref Range   Color, Urine STRAW (A) YELLOW   APPearance CLEAR CLEAR   Specific Gravity, Urine 1.003 (L) 1.005 - 1.030   pH 8.0 5.0 - 8.0   Glucose, UA NEGATIVE NEGATIVE mg/dL   Hgb urine dipstick NEGATIVE NEGATIVE   Bilirubin Urine NEGATIVE NEGATIVE   Ketones, ur NEGATIVE NEGATIVE mg/dL   Protein, ur NEGATIVE NEGATIVE mg/dL   Nitrite NEGATIVE NEGATIVE   Leukocytes,Ua SMALL (A) NEGATIVE   RBC / HPF 0-5 0 - 5 RBC/hpf   WBC, UA 0-5 0 - 5 WBC/hpf   Bacteria, UA RARE (A) NONE SEEN   Squamous Epithelial / LPF 6-10 0 - 5  Protein / creatinine ratio, urine     Status: None   Collection Time: 12/04/18 11:12 PM  Result Value Ref Range   Creatinine, Urine 25.58 mg/dL   Total Protein, Urine <6 mg/dL   Protein Creatinine Ratio        0.00 - 0.15 mg/mg[Cre]  CBC     Status: Abnormal   Collection Time: 12/04/18 11:41 PM  Result Value Ref Range   WBC 9.3 4.0 - 10.5 K/uL   RBC 3.79 (L) 3.87 - 5.11 MIL/uL   Hemoglobin 11.6 (L) 12.0 - 15.0 g/dL   HCT 75.4 (L) 49.2 - 01.0 %   MCV 86.5 80.0 - 100.0 fL   MCH 30.6 26.0 - 34.0 pg   MCHC 35.4 30.0 - 36.0 g/dL   RDW 07.1 21.9 - 75.8 %   Platelets 232 150 - 400 K/uL   nRBC 0.0 0.0 - 0.2 %  Comprehensive metabolic panel     Status: Abnormal   Collection Time: 12/04/18 11:41 PM  Result Value Ref Range   Sodium 138 135 - 145 mmol/L   Potassium 2.8 (L) 3.5 - 5.1 mmol/L   Chloride 106 98 - 111 mmol/L   CO2 23 22 - 32 mmol/L   Glucose, Bld 79 70 - 99 mg/dL   BUN <5 (L) 6 - 20 mg/dL    Creatinine, Ser 8.32 0.44 - 1.00 mg/dL   Calcium 9.0 8.9 - 54.9 mg/dL   Total Protein 5.7 (L) 6.5 - 8.1 g/dL   Albumin 2.7 (L) 3.5 - 5.0 g/dL   AST 20 15 - 41 U/L   ALT 14 0 - 44 U/L   Alkaline Phosphatase 107 38 - 126 U/L  Total Bilirubin 0.4 0.3 - 1.2 mg/dL   GFR calc non Af Amer >60 >60 mL/min   GFR calc Af Amer >60 >60 mL/min   Anion gap 9 5 - 15       Imaging:  No results found.  MAU Course/MDM: I have ordered labs and reviewed results.  Preeclampsia labs normal Urine showed signs of possible UTI Pyridium given with good relief of pain, suspect UTI as source Pepcid given for reflux  NST reviewed, reactive, category I. >> just prior to discharge, had a large variable decel  No contractions Consult Dr Earlene Plateravis with presentation, exam findings and test results.  We did a BPP which was 8/8 AFI was normal We monitored her for two more hours and FHR tracing was reassuring throughout Dischargedhome  Assessment: Single IUP at 6857w6d Lower abdominal pain, likely UTI Gestational hypertension, intermittent hypertension No sign of preeclampsia Hypokalemia  Plan: Discharge home Rx Keflex for presumed UTI Rx Pyridium for dysuria Preeclampsia precautions Rx 3 day course of K-Dur for hypokalemia Preterm Labor precautions and fetal kick counts Follow up in Office for prenatal visits and recheck, advised to call office today  Encouraged to return here or to other Urgent Care/ED if she develops worsening of symptoms, increase in pain, fever, or other concerning symptoms.   Pt stable at time of discharge.  Wynelle BourgeoisMarie Kaleeah Gingerich CNM, MSN Certified Nurse-Midwife 12/04/2018 11:08 PM

## 2018-12-04 NOTE — MAU Note (Signed)
Presents with lower abd pain; alternation from sharp, shooting to a dull ache starting approx two hours ago. No LOF or vag bldg. C/o dysuria.   Gilmer Mor RN

## 2018-12-05 ENCOUNTER — Inpatient Hospital Stay (HOSPITAL_BASED_OUTPATIENT_CLINIC_OR_DEPARTMENT_OTHER): Payer: BC Managed Care – PPO

## 2018-12-05 DIAGNOSIS — Z3A33 33 weeks gestation of pregnancy: Secondary | ICD-10-CM | POA: Diagnosis not present

## 2018-12-05 DIAGNOSIS — O368330 Maternal care for abnormalities of the fetal heart rate or rhythm, third trimester, not applicable or unspecified: Secondary | ICD-10-CM

## 2018-12-05 LAB — COMPREHENSIVE METABOLIC PANEL
ALT: 14 U/L (ref 0–44)
AST: 20 U/L (ref 15–41)
Albumin: 2.7 g/dL — ABNORMAL LOW (ref 3.5–5.0)
Alkaline Phosphatase: 107 U/L (ref 38–126)
Anion gap: 9 (ref 5–15)
BUN: <5 mg/dL — ABNORMAL LOW (ref 6–20)
CO2: 23 mmol/L (ref 22–32)
Calcium: 9 mg/dL (ref 8.9–10.3)
Chloride: 106 mmol/L (ref 98–111)
Creatinine, Ser: 0.49 mg/dL (ref 0.44–1.00)
GFR calc Af Amer: >60 mL/min (ref >60)
GFR calc non Af Amer: >60 mL/min (ref >60)
Glucose, Bld: 79 mg/dL (ref 70–99)
Potassium: 2.8 mmol/L — ABNORMAL LOW (ref 3.5–5.1)
Sodium: 138 mmol/L (ref 135–145)
Total Bilirubin: 0.4 mg/dL (ref 0.3–1.2)
Total Protein: 5.7 g/dL — ABNORMAL LOW (ref 6.5–8.1)

## 2018-12-05 LAB — CBC
HCT: 32.8 % — ABNORMAL LOW (ref 36.0–46.0)
Hemoglobin: 11.6 g/dL — ABNORMAL LOW (ref 12.0–15.0)
MCH: 30.6 pg (ref 26.0–34.0)
MCHC: 35.4 g/dL (ref 30.0–36.0)
MCV: 86.5 fL (ref 80.0–100.0)
Platelets: 232 10*3/uL (ref 150–400)
RBC: 3.79 MIL/uL — ABNORMAL LOW (ref 3.87–5.11)
RDW: 12.7 % (ref 11.5–15.5)
WBC: 9.3 10*3/uL (ref 4.0–10.5)
nRBC: 0 % (ref 0.0–0.2)

## 2018-12-05 LAB — PROTEIN / CREATININE RATIO, URINE
Creatinine, Urine: 25.58 mg/dL
Total Protein, Urine: <6 mg/dL

## 2018-12-05 MED ORDER — CEPHALEXIN 500 MG PO CAPS: 500.0000 mg | ORAL_CAPSULE | Freq: Four times a day (QID) | ORAL | 2 refills | Status: DC

## 2018-12-05 MED ORDER — PHENAZOPYRIDINE HCL 100 MG PO TABS: 100.0000 mg | ORAL_TABLET | Freq: Three times a day (TID) | ORAL | 0 refills | Status: DC | PRN

## 2018-12-05 MED ORDER — POTASSIUM CHLORIDE CRYS ER 20 MEQ PO TBCR: 40.0000 meq | EXTENDED_RELEASE_TABLET | Freq: Every day | ORAL | 0 refills | Status: DC

## 2018-12-05 MED ORDER — FAMOTIDINE 20 MG PO TABS
20.0000 mg | ORAL_TABLET | Freq: Once | ORAL | Status: AC
  Administered 2018-12-05: 20 mg via ORAL
  Filled 2018-12-05: qty 1

## 2018-12-05 MED ORDER — PHENAZOPYRIDINE HCL 100 MG PO TABS
100.0000 mg | ORAL_TABLET | Freq: Once | ORAL | Status: AC
  Administered 2018-12-05: 100 mg via ORAL
  Filled 2018-12-05: qty 1

## 2018-12-05 NOTE — Discharge Instructions (Signed)
Pregnancy and Urinary Tract Infection ° °A urinary tract infection (UTI) is an infection of any part of the urinary tract. This includes the kidneys, the tubes that connect your kidneys to your bladder (ureters), the bladder, and the tube that carries urine out of your body (urethra). These organs make, store, and get rid of urine in the body. Your health care provider may use other names to describe the infection. An upper UTI affects the ureters and kidneys (pyelonephritis). A lower UTI affects the bladder (cystitis) and urethra (urethritis). °Most urinary tract infections are caused by bacteria in your genital area, around the entrance to your urinary tract (urethra). These bacteria grow and cause irritation and inflammation of your urinary tract. You are more likely to develop a UTI during pregnancy because the physical and hormonal changes your body goes through can make it easier for bacteria to get into your urinary tract. Your growing baby also puts pressure on your bladder and can affect urine flow. It is important to recognize and treat UTIs in pregnancy because of the risk of serious complications for both you and your baby. °How does this affect me? °Symptoms of a UTI include: °· Needing to urinate right away (urgently). °· Frequent urination or passing small amounts of urine frequently. °· Pain or burning with urination. °· Blood in the urine. °· Urine that smells bad or unusual. °· Trouble urinating. °· Cloudy urine. °· Pain in the abdomen or lower back. °· Vaginal discharge. °You may also have: °· Vomiting or a decreased appetite. °· Confusion. °· Irritability or tiredness. °· A fever. °· Diarrhea. °How does this affect my baby? °An untreated UTI during pregnancy could lead to a kidney infection or a systemic infection, which can cause health problems that could affect your baby. Possible complications of an untreated UTI include: °· Giving birth to your baby before 37 weeks of pregnancy  (premature). °· Having a baby with a low birth weight. °· Developing high blood pressure during pregnancy (preeclampsia). °· Having a low hemoglobin level (anemia). °What can I do to lower my risk? °To prevent a UTI: °· Go to the bathroom as soon as you feel the need. Do not hold urine for long periods of time. °· Always wipe from front to back, especially after a bowel movement. Use each tissue one time when you wipe. °· Empty your bladder after sex. °· Keep your genital area dry. °· Drink 6-10 glasses of water each day. °· Do not douche or use deodorant sprays. °How is this treated? °Treatment for this condition may include: °· Antibiotic medicines that are safe to take during pregnancy. °· Other medicines to treat less common causes of UTI. °Follow these instructions at home: °· If you were prescribed an antibiotic medicine, take it as told by your health care provider. Do not stop using the antibiotic even if you start to feel better. °· Keep all follow-up visits as told by your health care provider. This is important. °Contact a health care provider if: °· Your symptoms do not improve or they get worse. °· You have abnormal vaginal discharge. °Get help right away if you: °· Have a fever. °· Have nausea and vomiting. °· Have back or side pain. °· Feel contractions in your uterus. °· Have lower belly pain. °· Have a gush of fluid from your vagina. °· Have blood in your urine. °Summary °· A urinary tract infection (UTI) is an infection of any part of the urinary tract, which includes the   kidneys, ureters, bladder, and urethra.  Most urinary tract infections are caused by bacteria in your genital area, around the entrance to your urinary tract (urethra).  You are more likely to develop a UTI during pregnancy.  If you were prescribed an antibiotic medicine, take it as told by your health care provider. Do not stop using the antibiotic even if you start to feel better. This information is not intended to  replace advice given to you by your health care provider. Make sure you discuss any questions you have with your health care provider. Document Released: 07/01/2010 Document Revised: 06/28/2018 Document Reviewed: 02/07/2018 Elsevier Patient Education  2020 [employer] Hypertension During Pregnancy Hypertension is also called high blood pressure. High blood pressure means that the force of your blood moving in your body is too strong. It can cause problems for you and your baby. Different types of high blood pressure can happen during pregnancy. The types are:  High blood pressure before you got pregnant. This is called chronic hypertension.  This can continue during your pregnancy. Your doctor will want to keep checking your blood pressure. You may need medicine to keep your blood pressure under control while you are pregnant. You will need follow-up visits after you have your baby.  High blood pressure that goes up during pregnancy when it was normal before. This is called gestational hypertension. It will usually get better after you have your baby, but your doctor will need to watch your blood pressure to make sure that it is getting better.  Very high blood pressure during pregnancy. This is called preeclampsia. Very high blood pressure is an emergency that needs to be checked and treated right away.  You may develop very high blood pressure after giving birth. This is called postpartum preeclampsia. This usually occurs within 48 hours after childbirth but may occur up to 6 weeks after giving birth. This is rare. How does this affect me? If you have high blood pressure during pregnancy, you have a higher chance of developing high blood pressure:  As you get older.  If you get pregnant again. In some cases, high blood pressure during pregnancy can cause:  Stroke.  Heart attack.  Damage to the kidneys, lungs, or liver.  Preeclampsia.  Jerky movements you cannot control (convulsions  or seizures).  Problems with the placenta. How does this affect my baby? Your baby may:  Be born early.  Not weigh as much as he or she should.  Not handle labor well, leading to a c-section birth. What are the risks?  Having high blood pressure during a past pregnancy.  Being overweight.  Being 21 years old or older.  Being pregnant for the first time.  Being pregnant with more than one baby.  Becoming pregnant using fertility methods, such as IVF.  Having other problems, such as diabetes, or kidney disease.  Having family members who have high blood pressure. What can I do to lower my risk?   Keep a healthy weight.  Eat a healthy diet.  Follow what your doctor tells you about treating any medical problems that you had before becoming pregnant. It is very important to go to all of your doctor visits. Your doctor will check your blood pressure and make sure that your pregnancy is progressing as it should. Treatment should start early if a problem is found. How is this treated? Treatment for high blood pressure during pregnancy can differ depending on the type of high blood pressure you have  and how serious it is.  You may need to take blood pressure medicine.  If you have been taking medicine for your blood pressure, you may need to change the medicine during pregnancy if it is not safe for your baby.  If your doctor thinks that you could get very high blood pressure, he or she may tell you to take a low-dose aspirin during your pregnancy.  If you have very high blood pressure, you may need to stay in the hospital so you and your baby can be watched closely. You may also need to take medicine to lower your blood pressure. This medicine may be given by mouth or through an IV tube.  In some cases, if your condition gets worse, you may need to have your baby early. Follow these instructions at home: Eating and drinking   Drink enough fluid to keep your pee (urine)  pale yellow.  Avoid caffeine. Lifestyle  Do not use any products that contain nicotine or tobacco, such as cigarettes, e-cigarettes, and chewing tobacco. If you need help quitting, ask your doctor.  Do not use alcohol or drugs.  Avoid stress.  Rest and get plenty of sleep.  Regular exercise can help. Ask your doctor what kinds of exercise are best for you. General instructions  Take over-the-counter and prescription medicines only as told by your doctor.  Keep all prenatal and follow-up visits as told by your doctor. This is important. Contact a doctor if:  You have symptoms that your doctor told you to watch for, such as: ? Headaches. ? Nausea. ? Vomiting. ? Belly (abdominal) pain. ? Dizziness. ? Light-headedness. Get help right away if:  You have: ? Very bad belly pain that does not get better with treatment. ? A very bad headache that does not get better. ? Vomiting that does not get better. ? Sudden, fast weight gain. ? Sudden swelling in your hands, ankles, or face. ? Bleeding from your vagina. ? Blood in your pee. ? Blurry vision. ? Double vision. ? Shortness of breath. ? Chest pain. ? Weakness on one side of your body. ? Trouble talking.  Your baby is not moving as much as usual. Summary  High blood pressure is also called hypertension.  High blood pressure means that the force of your blood moving in your body is too strong.  High blood pressure can cause problems for you and your baby.  Keep all follow-up visits as told by your doctor. This is important. This information is not intended to replace advice given to you by your health care provider. Make sure you discuss any questions you have with your health care provider. Document Released: 04/08/2010 Document Revised: 06/27/2018 Document Reviewed: 04/02/2018 Elsevier Patient Education  2020 [employer].

## 2018-12-06 LAB — CULTURE, OB URINE
Culture: <10000 — AB
Special Requests: NORMAL

## 2018-12-16 ENCOUNTER — Other Ambulatory Visit: Payer: Self-pay

## 2018-12-16 ENCOUNTER — Inpatient Hospital Stay (HOSPITAL_COMMUNITY)
Admission: AD | Admit: 2018-12-16 | Discharge: 2018-12-16 | Disposition: A | Discharge status: Home / Self Care | Payer: BC Managed Care – PPO | Attending: Obstetrics and Gynecology | Admitting: Obstetrics and Gynecology

## 2018-12-16 ENCOUNTER — Encounter (HOSPITAL_COMMUNITY): Payer: Self-pay | Admitting: *Deleted

## 2018-12-16 DIAGNOSIS — R1031 Right lower quadrant pain: Secondary | ICD-10-CM | POA: Insufficient documentation

## 2018-12-16 DIAGNOSIS — Z3A35 35 weeks gestation of pregnancy: Secondary | ICD-10-CM | POA: Insufficient documentation

## 2018-12-16 DIAGNOSIS — O163 Unspecified maternal hypertension, third trimester: Secondary | ICD-10-CM | POA: Insufficient documentation

## 2018-12-16 DIAGNOSIS — F329 Major depressive disorder, single episode, unspecified: Secondary | ICD-10-CM | POA: Insufficient documentation

## 2018-12-16 DIAGNOSIS — Z79899 Other long term (current) drug therapy: Secondary | ICD-10-CM | POA: Diagnosis not present

## 2018-12-16 DIAGNOSIS — F419 Anxiety disorder, unspecified: Secondary | ICD-10-CM | POA: Diagnosis not present

## 2018-12-16 DIAGNOSIS — O169 Unspecified maternal hypertension, unspecified trimester: Secondary | ICD-10-CM | POA: Diagnosis present

## 2018-12-16 DIAGNOSIS — D649 Anemia, unspecified: Secondary | ICD-10-CM | POA: Insufficient documentation

## 2018-12-16 DIAGNOSIS — O99343 Other mental disorders complicating pregnancy, third trimester: Secondary | ICD-10-CM | POA: Diagnosis not present

## 2018-12-16 DIAGNOSIS — O26893 Other specified pregnancy related conditions, third trimester: Secondary | ICD-10-CM | POA: Diagnosis present

## 2018-12-16 DIAGNOSIS — O99013 Anemia complicating pregnancy, third trimester: Secondary | ICD-10-CM | POA: Insufficient documentation

## 2018-12-16 LAB — COMPREHENSIVE METABOLIC PANEL
ALT: 15 U/L (ref 0–44)
AST: 18 U/L (ref 15–41)
Albumin: 2.9 g/dL — ABNORMAL LOW (ref 3.5–5.0)
Alkaline Phosphatase: 137 U/L — ABNORMAL HIGH (ref 38–126)
Anion gap: 10 (ref 5–15)
BUN: <5 mg/dL — ABNORMAL LOW (ref 6–20)
CO2: 22 mmol/L (ref 22–32)
Calcium: 9.3 mg/dL (ref 8.9–10.3)
Chloride: 106 mmol/L (ref 98–111)
Creatinine, Ser: 0.65 mg/dL (ref 0.44–1.00)
GFR calc Af Amer: >60 mL/min (ref >60)
GFR calc non Af Amer: >60 mL/min (ref >60)
Glucose, Bld: 84 mg/dL (ref 70–99)
Potassium: 2.9 mmol/L — ABNORMAL LOW (ref 3.5–5.1)
Sodium: 138 mmol/L (ref 135–145)
Total Bilirubin: 0.3 mg/dL (ref 0.3–1.2)
Total Protein: 6.1 g/dL — ABNORMAL LOW (ref 6.5–8.1)

## 2018-12-16 LAB — CBC
HCT: 33.8 % — ABNORMAL LOW (ref 36.0–46.0)
Hemoglobin: 12.4 g/dL (ref 12.0–15.0)
MCH: 31.4 pg (ref 26.0–34.0)
MCHC: 36.7 g/dL — ABNORMAL HIGH (ref 30.0–36.0)
MCV: 85.6 fL (ref 80.0–100.0)
Platelets: 229 10*3/uL (ref 150–400)
RBC: 3.95 MIL/uL (ref 3.87–5.11)
RDW: 13 % (ref 11.5–15.5)
WBC: 10.6 10*3/uL — ABNORMAL HIGH (ref 4.0–10.5)
nRBC: 0 % (ref 0.0–0.2)

## 2018-12-16 LAB — WET PREP, GENITAL
Clue Cells Wet Prep HPF POC: NONE SEEN
Sperm: NONE SEEN
Trich, Wet Prep: NONE SEEN
Yeast Wet Prep HPF POC: NONE SEEN

## 2018-12-16 LAB — PROTEIN / CREATININE RATIO, URINE
Creatinine, Urine: 22.13 mg/dL
Total Protein, Urine: <6 mg/dL

## 2018-12-16 LAB — POCT FERN TEST: POCT Fern Test: NEGATIVE

## 2018-12-16 MED ORDER — ACETAMINOPHEN 500 MG PO TABS: 1000.0000 mg | ORAL_TABLET | Freq: Once | ORAL | Status: DC

## 2018-12-16 NOTE — Discharge Instructions (Signed)
Your tests results from tonight are negative for pre-eclampsia. Please see the included instructions for symptoms to monitor for.

## 2018-12-16 NOTE — MAU Note (Signed)
Patient presents to MAU c/o of possible LOF. Patient states she's leaking clear, watery, fluid every time baby moves. Patient intermittent Sharp, stabbing pain RLQ +FM. Denies vaginal bleeding.

## 2018-12-16 NOTE — MAU Provider Note (Signed)
History     CSN: [id number]  Arrival date and time: 12/16/18 2003   First Provider Initiated Contact with Patient 12/16/18 2050      Chief Complaint  Patient presents with  . Rupture of Membranes   HPI  Ms.  Molly Martin is a 23 y.o. year old G15P0000 female at [redacted]w[redacted]d weeks gestation who presents to MAU reporting leaking clear,watery fluid every time the baby movesand intermittent RLQ sharp, stabbing pain since 1800. She also reports a H/A that "hurts with eye movement after being up all day" x 1- 1.5 weeks. She took Tylenol once for the H/A, but nothing taken today. She reports good (+) FM. She denies VB. Since arriving to MAU she has had elevated BP, but denies any elevated BPs when at [facility] office. She reports one other time of having elevated BPs while being in MAU in the past. She is not on any HTN medications. She receives [facility] with [facility]. Her next appt is 12/19/2018.   Past Medical History:  Diagnosis Date  . Anemia   . Anxiety   . Depression     Past Surgical History:  Procedure Laterality Date  . I&D EXTREMITY Right 03/31/2018   Procedure: IRRIGATION AND DEBRIDEMENT EXTREMITY right hand;  Surgeon: Renette Butters, MD;  Location: [facility];  Service: Orthopedics;  Laterality: Right;  . TONSILLECTOMY      Family History  Problem Relation Age of Onset  . Bipolar disorder Mother   . Hypertension Father   . Depression Sister   . Stroke Maternal Grandmother   . Cancer Maternal Grandfather   . Anxiety disorder Paternal Grandmother     Social History   Tobacco Use  . Smoking status: Never Smoker  . Smokeless tobacco: Never Used  Substance Use Topics  . Alcohol use: No    Frequency: Never  . Drug use: No    Allergies: No Known Allergies  Medications Prior to Admission  Medication Sig Dispense Refill Last Dose  . escitalopram (LEXAPRO) 10 MG tablet Take 10 mg by mouth daily.   12/16/2018 at Unknown time  . Iron Combinations (CHROMAGEN) capsule Take 1  capsule by mouth daily. Taking 65mg  daily   12/16/2018 at Unknown time  . prenatal vitamin w/FE, FA (PRENATAL 1 + 1) 27-1 MG TABS tablet Take 1 tablet by mouth daily at 12 noon.   12/16/2018 at Unknown time  . busPIRone HCl (BUSPAR PO) Take by mouth.     . cephALEXin (KEFLEX) 500 MG capsule Take 1 capsule (500 mg total) by mouth 4 (four) times daily. 28 capsule 2   . Elastic Bandages & Supports (COMFORT FIT MATERNITY SUPP LG) MISC 1 Units by Does not apply route daily. 1 each 0   . phenazopyridine (PYRIDIUM) 100 MG tablet Take 1 tablet (100 mg total) by mouth 3 (three) times daily as needed for pain. 10 tablet 0   . potassium chloride SA (K-DUR) 20 MEQ tablet Take 2 tablets (40 mEq total) by mouth daily for 3 days. 6 tablet 0     Review of Systems  Constitutional: Negative.   HENT: Negative.   Eyes: Negative.   Respiratory: Negative.   Cardiovascular: Negative.   Gastrointestinal: Negative.   Endocrine: Negative.   Genitourinary: Positive for pelvic pain (sharp, stabbing intermittent RLQ pain) and vaginal discharge.  Musculoskeletal: Negative.   Skin: Negative.   Allergic/Immunologic: Negative.   Neurological: Positive for headaches (with eye movement).  Hematological: Negative.   Psychiatric/Behavioral: Negative.  Physical Exam   Patient Vitals for the past 24 hrs:  BP Temp Temp src Pulse Resp SpO2 Height Weight  12/16/18 2301 (!) 145/86 - - 63 - - - -  12/16/18 2246 (!) 143/90 - - 67 - - - -  12/16/18 2231 140/84 - - 62 - - - -  12/16/18 2217 (!) 150/91 - - 63 - - - -  12/16/18 2201 (!) 152/95 - - 72 - - - -  12/16/18 2146 (!) 153/93 - - 67 - - - -  12/16/18 2135 (!) 150/83 - - 67 - - - -  12/16/18 2106 (!) 151/101 - - 77 - - - -  12/16/18 2050 (!) 144/95 - - 70 - - - -  12/16/18 2043 (!) 147/94 - - 71 - - - -  12/16/18 2021 (!) 149/95 98 F (36.7 C) Oral 76 19 98 % 5\' 6"  (1.676 m) 91.5 kg    Physical Exam  Nursing note and vitals reviewed. Constitutional: She is  oriented to person, place, and time. She appears well-developed and well-nourished.  HENT:  Head: Normocephalic and atraumatic.  Eyes: Pupils are equal, round, and reactive to light.  Neck: Normal range of motion.  Cardiovascular: Normal rate.  Respiratory: Effort normal.  GI: Soft.  Genitourinary:    Genitourinary Comments: Dilation: Closed Effacement (%): Thick Cervical Position: Posterior Exam by: R.Rudell Marlowe,CNM    Musculoskeletal: Normal range of motion.  Neurological: She is alert and oriented to person, place, and time.  Skin: Skin is warm and dry.  Psychiatric: She has a normal mood and affect. Her behavior is normal. Judgment and thought content normal.    NST - FHR: 135 bpm / moderate variability / accels present / decels absent / TOCO: some UI noted   MAU Course  Procedures  MDM CCUA Fern Slide CBC CMP P/C Ratio Serial BP's  Tylenol 1000 mg ordered, but not given by RN staff -- patient states she "can just take some when she gets home"  *Consult with Dr. @ 2326 - notified of patient's complaints, assessments, lab & NST results, tx plan d/c home, F/U on Thursday 12/19/18 - ok to d/c home, agrees with plan   Results for orders placed or performed during the hospital encounter of 12/16/18 (from the past 24 hour(s))  Wet prep, genital     Status: Abnormal   Collection Time: 12/16/18  9:08 PM   Specimen: Vaginal  Result Value Ref Range   Yeast Wet Prep HPF POC NONE SEEN NONE SEEN   Trich, Wet Prep NONE SEEN NONE SEEN   Clue Cells Wet Prep HPF POC NONE SEEN NONE SEEN   WBC, Wet Prep HPF POC MANY (A) NONE SEEN   Sperm NONE SEEN   POCT fern test     Status: Normal   Collection Time: 12/16/18  9:20 PM  Result Value Ref Range   POCT Fern Test Negative = intact amniotic membranes   CBC     Status: Abnormal   Collection Time: 12/16/18  9:21 PM  Result Value Ref Range   WBC 10.6 (H) 4.0 - 10.5 K/uL   RBC 3.95 3.87 - 5.11 MIL/uL   Hemoglobin 12.4 12.0 - 15.0  g/dL   HCT 12/18/18 (L) 16.1 - 09.6 %   MCV 85.6 80.0 - 100.0 fL   MCH 31.4 26.0 - 34.0 pg   MCHC 36.7 (H) 30.0 - 36.0 g/dL   RDW 04.5 40.9 - 81.1 %   Platelets 229  150 - 400 K/uL   nRBC 0.0 0.0 - 0.2 %  Comprehensive metabolic panel     Status: Abnormal   Collection Time: 12/16/18  9:21 PM  Result Value Ref Range   Sodium 138 135 - 145 mmol/L   Potassium 2.9 (L) 3.5 - 5.1 mmol/L   Chloride 106 98 - 111 mmol/L   CO2 22 22 - 32 mmol/L   Glucose, Bld 84 70 - 99 mg/dL   BUN <5 (L) 6 - 20 mg/dL   Creatinine, Ser 4.090.65 0.44 - 1.00 mg/dL   Calcium 9.3 8.9 - 81.110.3 mg/dL   Total Protein 6.1 (L) 6.5 - 8.1 g/dL   Albumin 2.9 (L) 3.5 - 5.0 g/dL   AST 18 15 - 41 U/L   ALT 15 0 - 44 U/L   Alkaline Phosphatase 137 (H) 38 - 126 U/L   Total Bilirubin 0.3 0.3 - 1.2 mg/dL   GFR calc non Af Amer >60 >60 mL/min   GFR calc Af Amer >60 >60 mL/min   Anion gap 10 5 - 15  Protein / creatinine ratio, urine     Status: None   Collection Time: 12/16/18 10:20 PM  Result Value Ref Range   Creatinine, Urine 22.13 mg/dL   Total Protein, Urine <6 mg/dL   Protein Creatinine Ratio RESULT BELOW REPORTABLE RANGE,  UNABLE TO CALCULATE.      0.00 - 0.15 mg/mg[Cre]      Assessment and Plan  Elevated blood pressure affecting pregnancy, antepartum - Plan: Discharge patient - Information provided on HTN in pregnancy and PEC s/sx's to monitor for - Keep scheduled appt on 12/19/18 - Patient verbalized an understanding of the plan of care and agrees.    Raelyn Moraolitta Dow Blahnik, MSN, CNM 12/16/2018, 9:06 PM

## 2018-12-19 LAB — OB RESULTS CONSOLE GC/CHLAMYDIA
Chlamydia: NEGATIVE
Gonorrhea: NEGATIVE

## 2018-12-19 IMAGING — US US PELVIS COMPLETE TRANSABD/TRANSVAG
1 series · 14 of 25 positions shown · non-contrast
Comparison: None

CLINICAL DATA: Lower abdominal pain.

EXAM:
TRANSABDOMINAL AND TRANSVAGINAL ULTRASOUND OF PELVIS
TECHNIQUE: Both transabdominal and transvaginal ultrasound examinations of the
pelvis were performed. Transabdominal technique was performed for
global imaging of the pelvis including uterus, ovaries, adnexal
regions, and pelvic cul-de-sac. It was necessary to proceed with
endovaginal exam following the transabdominal exam to visualize the
ovaries and adnexa.

[Series 1: us pelvis complete transabd/transvag · 0.17mm/px · 14 of 57 slices shown]
[im 1/57]
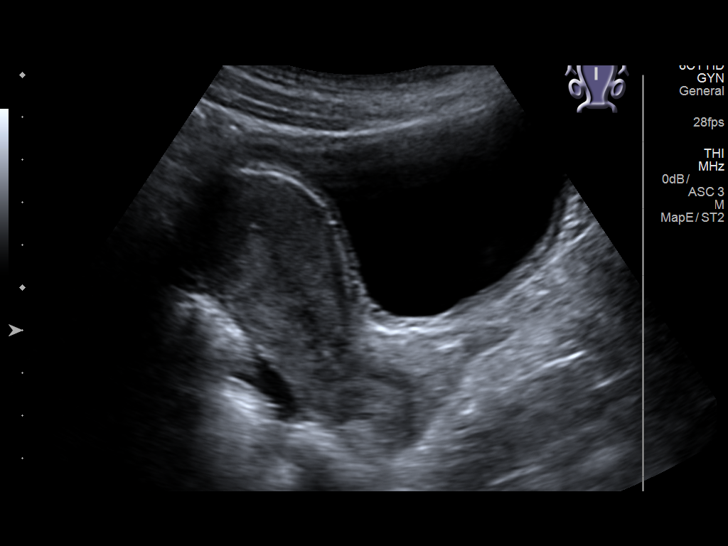
[im 5/57]
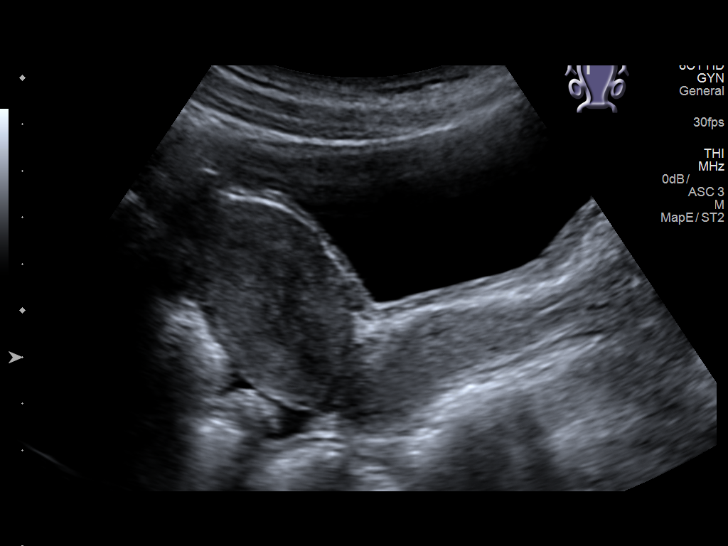
[im 10/57]
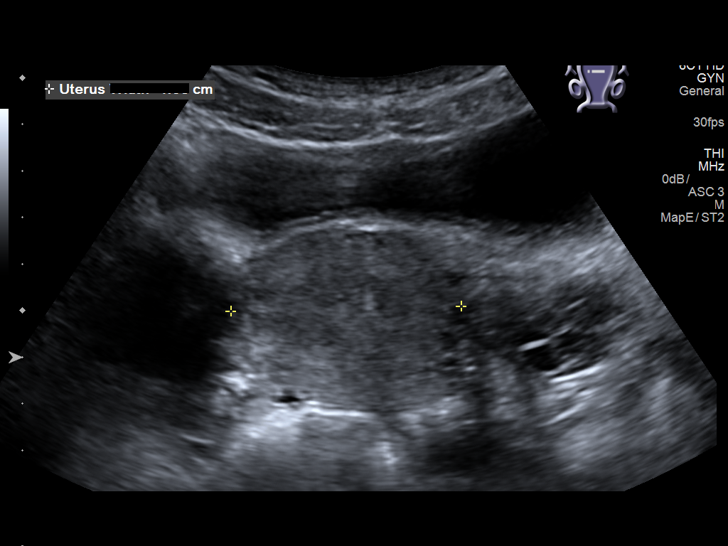
[im 15/57]
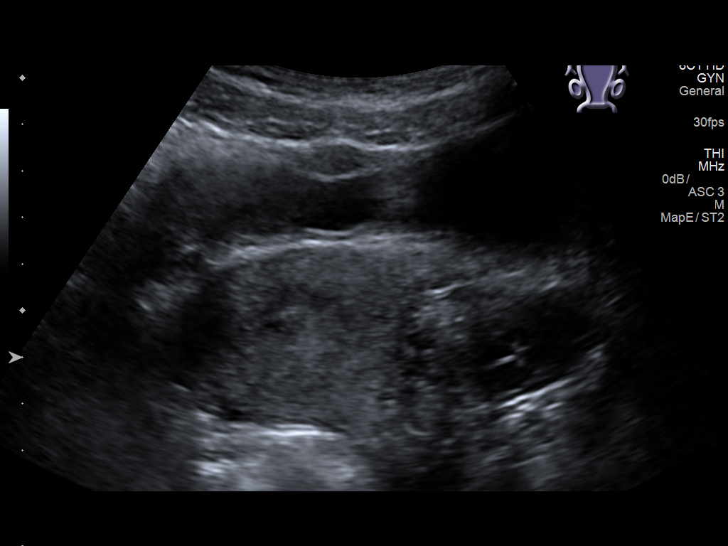
[im 19/57]
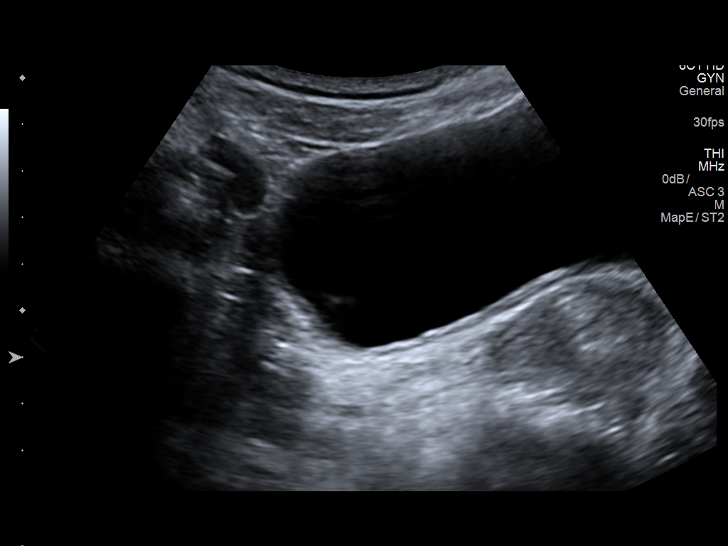
[im 22/57]
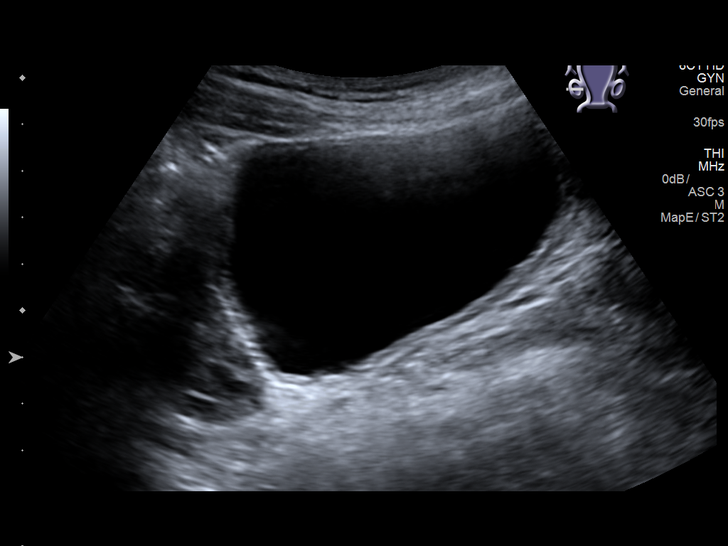
[im 26/57]
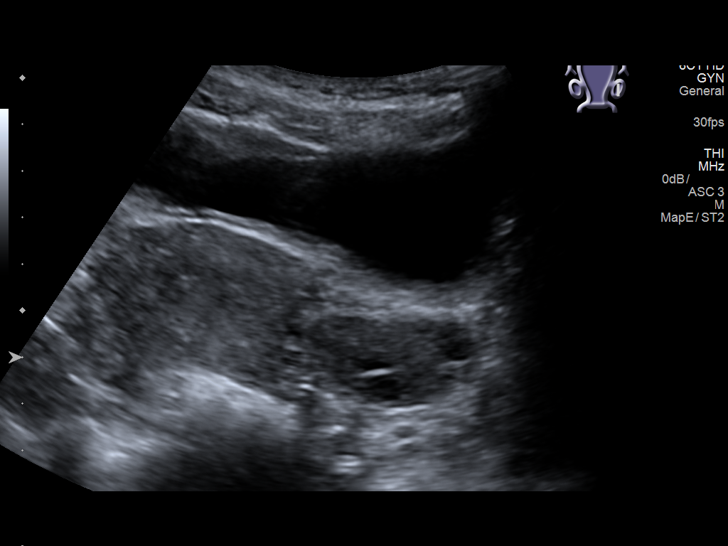
[im 31/57]
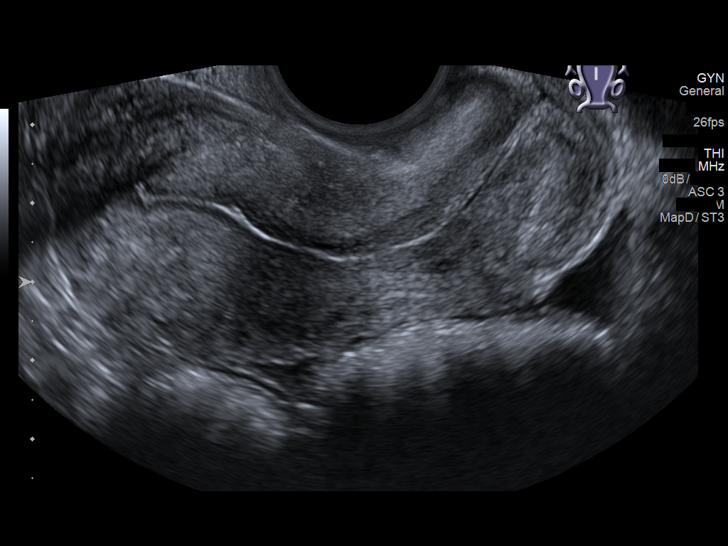
[im 36/57]
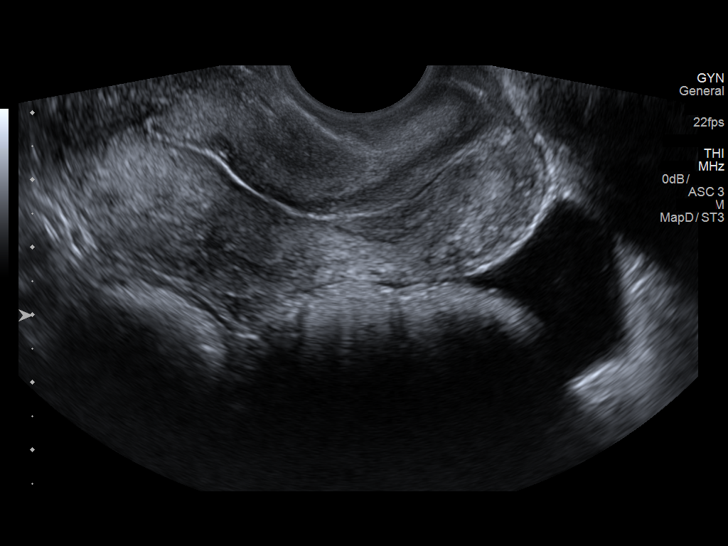
[im 38/57]
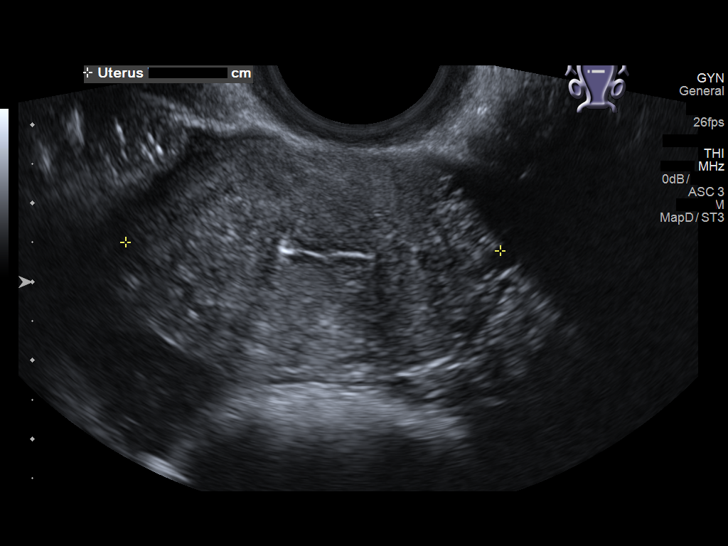
[im 43/57]
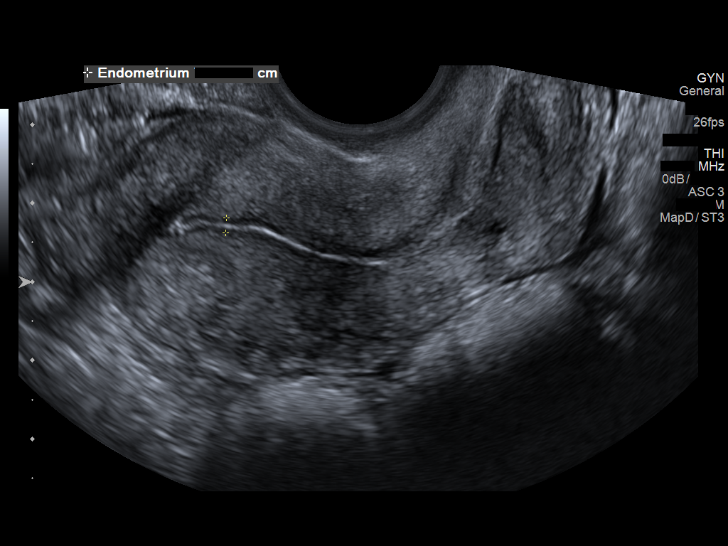
[im 47/57]
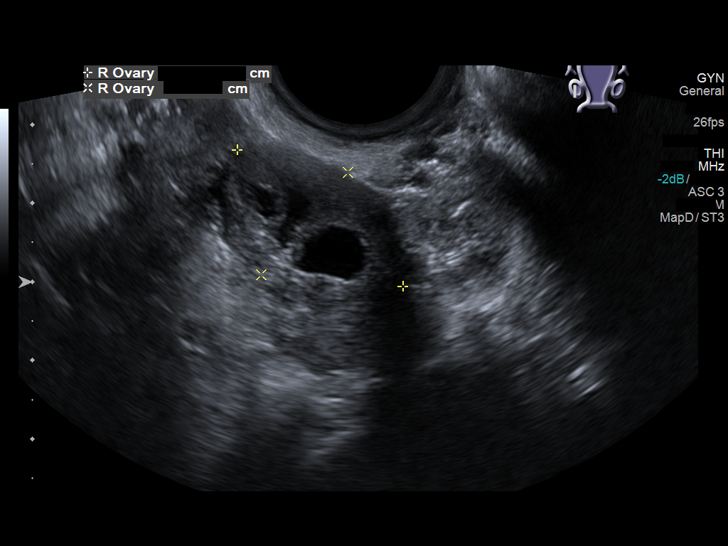
[im 52/57]
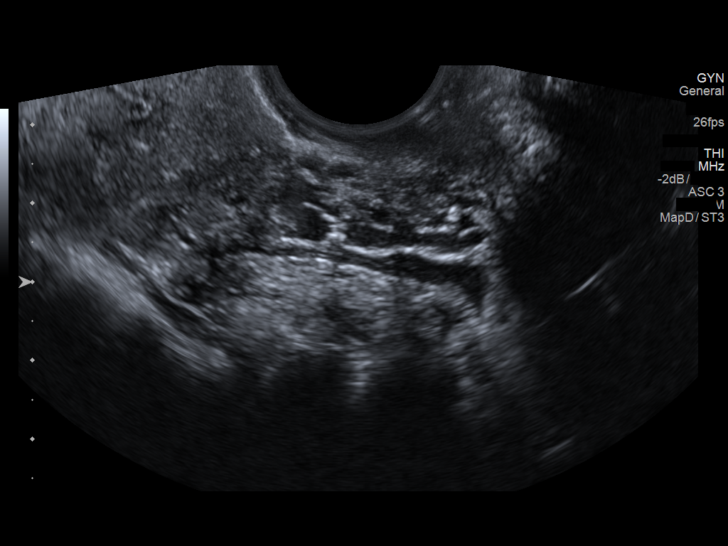
[im 57/57]
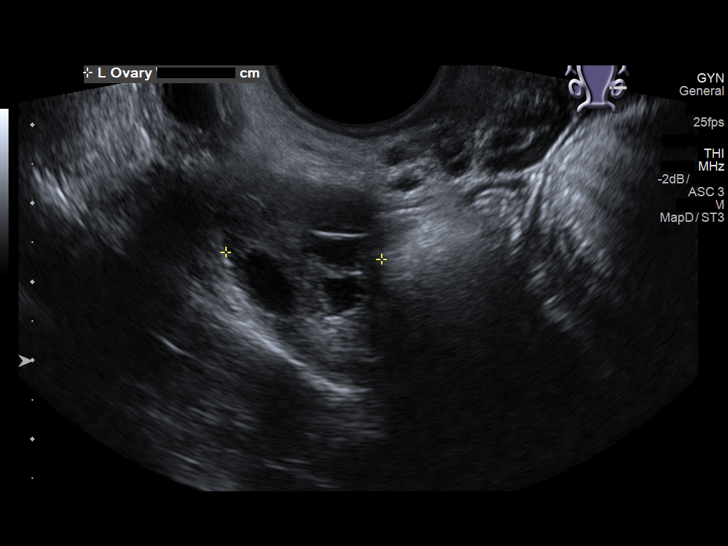

[14 of 25 positions shown; findings below may reference images not displayed]

FINDINGS: Uterus

Measurements: 7.1 x 3.5 x 4.8 cm. No fibroids or other mass
visualized.

Endometrium

Thickness: 2 mm, normal..  No focal abnormality visualized.

Right ovary

Measurements: 2.7 x 1.7 x 2.0 cm. Normal appearance with physiologic
follicles and blood flow. No adnexal mass.

Left ovary

Measurements: 3.9 x 2.2 x 2.0 cm. Normal appearance with physiologic
follicles and blood flow. No adnexal mass.

Other findings

Small amount of simple free fluid in the pelvis is physiologic.
IMPRESSION: Normal pelvic ultrasound.

## 2018-12-22 ENCOUNTER — Inpatient Hospital Stay (HOSPITAL_COMMUNITY)
Admission: AD | Admit: 2018-12-22 | Discharge: 2018-12-27 | DRG: 807 | Disposition: A | Discharge status: Home / Self Care | Payer: BC Managed Care – PPO | Attending: Obstetrics and Gynecology | Admitting: Obstetrics and Gynecology

## 2018-12-22 ENCOUNTER — Encounter (HOSPITAL_COMMUNITY): Payer: Self-pay

## 2018-12-22 ENCOUNTER — Other Ambulatory Visit: Payer: Self-pay

## 2018-12-22 DIAGNOSIS — F419 Anxiety disorder, unspecified: Secondary | ICD-10-CM | POA: Diagnosis present

## 2018-12-22 DIAGNOSIS — Z20828 Contact with and (suspected) exposure to other viral communicable diseases: Secondary | ICD-10-CM | POA: Diagnosis present

## 2018-12-22 DIAGNOSIS — O1410 Severe pre-eclampsia, unspecified trimester: Secondary | ICD-10-CM | POA: Diagnosis present

## 2018-12-22 DIAGNOSIS — Z3A36 36 weeks gestation of pregnancy: Secondary | ICD-10-CM

## 2018-12-22 DIAGNOSIS — R519 Headache, unspecified: Secondary | ICD-10-CM | POA: Diagnosis present

## 2018-12-22 DIAGNOSIS — O99344 Other mental disorders complicating childbirth: Secondary | ICD-10-CM | POA: Diagnosis present

## 2018-12-22 DIAGNOSIS — O133 Gestational [pregnancy-induced] hypertension without significant proteinuria, third trimester: Secondary | ICD-10-CM

## 2018-12-22 DIAGNOSIS — O26893 Other specified pregnancy related conditions, third trimester: Secondary | ICD-10-CM | POA: Diagnosis not present

## 2018-12-22 LAB — CBC
HCT: 35.8 % — ABNORMAL LOW (ref 36.0–46.0)
Hemoglobin: 12.7 g/dL (ref 12.0–15.0)
MCH: 30.6 pg (ref 26.0–34.0)
MCHC: 35.5 g/dL (ref 30.0–36.0)
MCV: 86.3 fL (ref 80.0–100.0)
Platelets: 245 10*3/uL (ref 150–400)
RBC: 4.15 MIL/uL (ref 3.87–5.11)
RDW: 12.9 % (ref 11.5–15.5)
WBC: 10 10*3/uL (ref 4.0–10.5)
nRBC: 0 % (ref 0.0–0.2)

## 2018-12-22 LAB — URINALYSIS, ROUTINE W REFLEX MICROSCOPIC
Bilirubin Urine: NEGATIVE
Glucose, UA: NEGATIVE mg/dL
Hgb urine dipstick: NEGATIVE
Ketones, ur: NEGATIVE mg/dL
Leukocytes,Ua: NEGATIVE
Nitrite: NEGATIVE
Protein, ur: NEGATIVE mg/dL
Specific Gravity, Urine: 1.003 — ABNORMAL LOW (ref 1.005–1.030)
pH: 7 (ref 5.0–8.0)

## 2018-12-22 LAB — COMPREHENSIVE METABOLIC PANEL
ALT: 12 U/L (ref 0–44)
AST: 22 U/L (ref 15–41)
Albumin: 3 g/dL — ABNORMAL LOW (ref 3.5–5.0)
Alkaline Phosphatase: 158 U/L — ABNORMAL HIGH (ref 38–126)
Anion gap: 12 (ref 5–15)
BUN: 5 mg/dL — ABNORMAL LOW (ref 6–20)
CO2: 20 mmol/L — ABNORMAL LOW (ref 22–32)
Calcium: 8.9 mg/dL (ref 8.9–10.3)
Chloride: 106 mmol/L (ref 98–111)
Creatinine, Ser: 0.62 mg/dL (ref 0.44–1.00)
GFR calc Af Amer: >60 mL/min (ref >60)
GFR calc non Af Amer: >60 mL/min (ref >60)
Glucose, Bld: 75 mg/dL (ref 70–99)
Potassium: 2.8 mmol/L — ABNORMAL LOW (ref 3.5–5.1)
Sodium: 138 mmol/L (ref 135–145)
Total Bilirubin: 0.7 mg/dL (ref 0.3–1.2)
Total Protein: 6.2 g/dL — ABNORMAL LOW (ref 6.5–8.1)

## 2018-12-22 LAB — TYPE AND SCREEN
ABO/RH(D): AB POS
Antibody Screen: NEGATIVE

## 2018-12-22 LAB — PROTEIN / CREATININE RATIO, URINE
Creatinine, Urine: 23.25 mg/dL
Total Protein, Urine: <6 mg/dL

## 2018-12-22 LAB — SARS CORONAVIRUS 2 BY RT PCR (HOSPITAL ORDER, PERFORMED IN ~~LOC~~ HOSPITAL LAB): SARS Coronavirus 2: NEGATIVE

## 2018-12-22 LAB — GROUP B STREP BY PCR: Group B strep by PCR: NEGATIVE

## 2018-12-22 LAB — OB RESULTS CONSOLE GBS: GBS: NEGATIVE

## 2018-12-22 MED ORDER — LACTATED RINGERS IV SOLN
500.0000 mL | INTRAVENOUS | Status: DC | PRN
  Administered 2018-12-23: 500 mL via INTRAVENOUS

## 2018-12-22 MED ORDER — LIDOCAINE HCL (PF) 1 % IJ SOLN: 30.0000 mL | INTRAMUSCULAR | Status: DC | PRN

## 2018-12-22 MED ORDER — LACTATED RINGERS IV SOLN
INTRAVENOUS | Status: DC
  Administered 2018-12-22 – 2018-12-23 (×2): via INTRAVENOUS

## 2018-12-22 MED ORDER — ZOLPIDEM TARTRATE 5 MG PO TABS
5.0000 mg | ORAL_TABLET | Freq: Every evening | ORAL | Status: DC | PRN
  Administered 2018-12-23: 5 mg via ORAL
  Filled 2018-12-22: qty 1

## 2018-12-22 MED ORDER — SODIUM CHLORIDE 0.9 % IV SOLN: 5.0000 10*6.[IU] | Freq: Once | INTRAVENOUS | Status: DC

## 2018-12-22 MED ORDER — SOD CITRATE-CITRIC ACID 500-334 MG/5ML PO SOLN: 30.0000 mL | ORAL | Status: DC | PRN

## 2018-12-22 MED ORDER — MAGNESIUM SULFATE 40 G IN LACTATED RINGERS - SIMPLE
2.0000 g/h | INTRAVENOUS | Status: DC
  Administered 2018-12-22 – 2018-12-23 (×2): 2 g/h via INTRAVENOUS
  Filled 2018-12-22 (×2): qty 500

## 2018-12-22 MED ORDER — TERBUTALINE SULFATE 1 MG/ML IJ SOLN: 0.2500 mg | Freq: Once | INTRAMUSCULAR | Status: DC | PRN

## 2018-12-22 MED ORDER — ACETAMINOPHEN 325 MG PO TABS
650.0000 mg | ORAL_TABLET | ORAL | Status: DC | PRN
  Administered 2018-12-23: 650 mg via ORAL
  Filled 2018-12-22: qty 2

## 2018-12-22 MED ORDER — OXYTOCIN BOLUS FROM INFUSION
500.0000 mL | Freq: Once | INTRAVENOUS | Status: AC
  Administered 2018-12-23: 23:00:00 500 mL via INTRAVENOUS

## 2018-12-22 MED ORDER — OXYCODONE-ACETAMINOPHEN 5-325 MG PO TABS: 2.0000 | ORAL_TABLET | ORAL | Status: DC | PRN

## 2018-12-22 MED ORDER — MISOPROSTOL 25 MCG QUARTER TABLET
25.0000 ug | ORAL_TABLET | ORAL | Status: DC | PRN
  Administered 2018-12-22 – 2018-12-23 (×3): 25 ug via VAGINAL
  Filled 2018-12-22 (×4): qty 1

## 2018-12-22 MED ORDER — ACETAMINOPHEN 500 MG PO TABS
1000.0000 mg | ORAL_TABLET | Freq: Once | ORAL | Status: AC
  Administered 2018-12-22: 1000 mg via ORAL
  Filled 2018-12-22: qty 2

## 2018-12-22 MED ORDER — PENICILLIN G 3 MILLION UNITS IVPB - SIMPLE MED: 3.0000 10*6.[IU] | INTRAVENOUS | Status: DC

## 2018-12-22 MED ORDER — ONDANSETRON HCL 4 MG/2ML IJ SOLN: 4.0000 mg | Freq: Four times a day (QID) | INTRAMUSCULAR | Status: DC | PRN

## 2018-12-22 MED ORDER — HYDROXYZINE HCL 50 MG PO TABS
50.0000 mg | ORAL_TABLET | Freq: Four times a day (QID) | ORAL | Status: DC | PRN
  Filled 2018-12-22: qty 1

## 2018-12-22 MED ORDER — OXYCODONE-ACETAMINOPHEN 5-325 MG PO TABS: 1.0000 | ORAL_TABLET | ORAL | Status: DC | PRN

## 2018-12-22 MED ORDER — FENTANYL CITRATE (PF) 100 MCG/2ML IJ SOLN: 50.0000 ug | INTRAMUSCULAR | Status: DC | PRN

## 2018-12-22 MED ORDER — FLEET ENEMA 7-19 GM/118ML RE ENEM: 1.0000 | ENEMA | RECTAL | Status: DC | PRN

## 2018-12-22 MED ORDER — ESCITALOPRAM OXALATE 10 MG PO TABS
10.0000 mg | ORAL_TABLET | Freq: Every day | ORAL | Status: DC
  Filled 2018-12-22: qty 1

## 2018-12-22 MED ORDER — OXYTOCIN 40 UNITS IN NORMAL SALINE INFUSION - SIMPLE MED: 2.5000 [IU]/h | INTRAVENOUS | Status: DC

## 2018-12-22 MED ORDER — MAGNESIUM SULFATE BOLUS VIA INFUSION
4.0000 g | Freq: Once | INTRAVENOUS | Status: AC
  Administered 2018-12-22: 4 g via INTRAVENOUS
  Filled 2018-12-22: qty 500

## 2018-12-22 NOTE — H&P (Signed)
Molly Martin is a 23 y.o. female presenting for headache worse today than ever in pregnancy. She had an episode of losing half of visual field and numbness/tingling of left hand with headache before coming to hospital this pm. Also had RUQ pain at that time. She has history of migraine but not to this degree. She received Tylenol in MAU and now HA better and vision normal. No more tingling in hand. OB History    Gravida  1   Para  0   Term  0   Preterm  0   AB  0   Living  0     SAB  0   TAB  0   Ectopic  0   Multiple  0   Live Births  0          Past Medical History:  Diagnosis Date  . Anemia   . Anxiety   . Depression    Past Surgical History:  Procedure Laterality Date  . I&D EXTREMITY Right 03/31/2018   Procedure: IRRIGATION AND DEBRIDEMENT EXTREMITY right hand;  Surgeon: Renette Butters, MD;  Location: [facility];  Service: Orthopedics;  Laterality: Right;  . TONSILLECTOMY     Family History: family history includes Anxiety disorder in her paternal grandmother; Bipolar disorder in her mother; Cancer in her maternal grandfather; Depression in her sister; Hypertension in her father; Stroke in her maternal grandmother. Social History:  reports that she has never smoked. She has never used smokeless tobacco. She reports that she does not drink alcohol or use drugs.     Maternal Diabetes: No Genetic Screening: Normal Maternal Ultrasounds/Referrals: Normal Fetal Ultrasounds or other Referrals:  None Maternal Substance Abuse:  No Significant Maternal Medications:  None Significant Maternal Lab Results:  None Other Comments:  None  Review of Systems  Eyes: Positive for blurred vision.  Gastrointestinal: Positive for abdominal pain.  Neurological: Positive for headaches.   Maternal Medical History:  Fetal activity: Perceived fetal activity is normal.      Dilation: Closed Effacement (%): Thick Station: -3 Exam by:: Maryelizabeth Kaufmann, CNM Blood pressure (!)  146/99, pulse 65, temperature 98.2 F (36.8 C), temperature source Oral, resp. rate 18, height 5\' 4"  (1.626 m), weight 90.7 kg, last menstrual period 03/03/2018, SpO2 98 %.   Fetal Exam Fetal State Assessment: Category I - tracings are normal.     Physical Exam  Cardiovascular: Normal rate.  Respiratory: Effort normal.  GI: Soft.  No RUQ tenderness  Neurological: She has normal reflexes.  DTR 2+    Face/smile symmetric Visual fields intact to confrontation Grip strength 3+ and equal Cx cl/th/high per NP check Vertex per bedside U/S by NP  Prenatal labs: ABO, Rh:   Antibody:   Rubella:   RPR:    HBsAg:    HIV:    GBS:   Done in MAU>result pending  Results for orders placed or performed during the hospital encounter of 12/22/18 (from the past 24 hour(s))  CBC     Status: Abnormal   Collection Time: 12/22/18  7:53 PM  Result Value Ref Range   WBC 10.0 4.0 - 10.5 K/uL   RBC 4.15 3.87 - 5.11 MIL/uL   Hemoglobin 12.7 12.0 - 15.0 g/dL   HCT 35.8 (L) 36.0 - 46.0 %   MCV 86.3 80.0 - 100.0 fL   MCH 30.6 26.0 - 34.0 pg   MCHC 35.5 30.0 - 36.0 g/dL   RDW 12.9 11.5 - 15.5 %  Platelets 245 150 - 400 K/uL   nRBC 0.0 0.0 - 0.2 %  Comprehensive metabolic panel     Status: Abnormal   Collection Time: 12/22/18  7:53 PM  Result Value Ref Range   Sodium 138 135 - 145 mmol/L   Potassium 2.8 (L) 3.5 - 5.1 mmol/L   Chloride 106 98 - 111 mmol/L   CO2 20 (L) 22 - 32 mmol/L   Glucose, Bld 75 70 - 99 mg/dL   BUN 5 (L) 6 - 20 mg/dL   Creatinine, Ser 1.61 0.44 - 1.00 mg/dL   Calcium 8.9 8.9 - 09.6 mg/dL   Total Protein 6.2 (L) 6.5 - 8.1 g/dL   Albumin 3.0 (L) 3.5 - 5.0 g/dL   AST 22 15 - 41 U/L   ALT 12 0 - 44 U/L   Alkaline Phosphatase 158 (H) 38 - 126 U/L   Total Bilirubin 0.7 0.3 - 1.2 mg/dL   GFR calc non Af Amer >60 >60 mL/min   GFR calc Af Amer >60 >60 mL/min   Anion gap 12 5 - 15  Urinalysis, Routine w reflex microscopic     Status: Abnormal   Collection Time: 12/22/18   7:57 PM  Result Value Ref Range   Color, Urine STRAW (A) YELLOW   APPearance CLEAR CLEAR   Specific Gravity, Urine 1.003 (L) 1.005 - 1.030   pH 7.0 5.0 - 8.0   Glucose, UA NEGATIVE NEGATIVE mg/dL   Hgb urine dipstick NEGATIVE NEGATIVE   Bilirubin Urine NEGATIVE NEGATIVE   Ketones, ur NEGATIVE NEGATIVE mg/dL   Protein, ur NEGATIVE NEGATIVE mg/dL   Nitrite NEGATIVE NEGATIVE   Leukocytes,Ua NEGATIVE NEGATIVE  Protein / creatinine ratio, urine     Status: None   Collection Time: 12/22/18  7:57 PM  Result Value Ref Range   Creatinine, Urine 23.25 mg/dL   Total Protein, Urine <6 mg/dL   Protein Creatinine Ratio        0.00 - 0.15 mg/mg[Cre]   Assessment/Plan: 23 yo G1P0 at 59 2/7 weeks Preeclampsia with severe symptoms D/W patient above and two stage IOL-risks and benefits Magnesium sulfate for seizure prophylaxis GBBS PCR pending. If positive will treat per protocol Repeat labs in am   Roselle Locus II 12/22/2018, 10:46 PM

## 2018-12-22 NOTE — MAU Note (Signed)
Pt here with c/o tingling and numbness in left hand and face. Tingling and numbness in face has subsided. Has a HA that started around 2pm. Took "small" Tylenol, which helped a little, but is still persistent. Feels like she has a "film" over her eyes and sees some "dots". Has some epigastric pain that started aobut 1.5hrs ago. Reports that it is sharp. Unsure if it's baby moving. Has HTN and is suppose to be induced on Friday. BP was 154/92. Reports good fetal movement.

## 2018-12-22 NOTE — MAU Provider Note (Signed)
History     CSN: [id number]  Arrival date and time: 12/22/18 1850   First Provider Initiated Contact with Patient 12/22/18 2001      Chief Complaint  Patient presents with  . Headache  . Blurred Vision  . Hypertension   HPI  Molly Martin is a 23 y.o. G1P0000 at 7063w2d who presents to MAU with multiple complaints. She denies vaginal bleeding, leaking of fluid, decreased fetal movement, fever, falls, or recent illness.   Headache This is a recurrent problem, current headache began around 1400 today. Patient endorses pain "like a belt" around the top of her head. She took "a Tylenol" around 1430 but did not experience relief. She endorses pain 9/10 upon initial assessment in MAU.  Visual Disturbances This is a recurrent problem, current episode began around 1830 today. Patient endorses history of floaters in her field of vision related to her astigmatism but states her current floaters are 10 times greater in number than those she typically sees. She also endorses a "film over both eyes" since 1830 today.  RUQ Pain This is a new problem, onset today around 1830. She is unable to provide pain score but endorses "uncomfortable feeling" not associated with fetal positioning.   OB History    Gravida  1   Para  0   Term  0   Preterm  0   AB  0   Living  0     SAB  0   TAB  0   Ectopic  0   Multiple  0   Live Births  0           Past Medical History:  Diagnosis Date  . Anemia   . Anxiety   . Depression     Past Surgical History:  Procedure Laterality Date  . I&D EXTREMITY Right 03/31/2018   Procedure: IRRIGATION AND DEBRIDEMENT EXTREMITY right hand;  Surgeon: Sheral ApleyMurphy, Timothy D, MD;  Location: [facility];  Service: Orthopedics;  Laterality: Right;  . TONSILLECTOMY      Family History  Problem Relation Age of Onset  . Bipolar disorder Mother   . Hypertension Father   . Depression Sister   . Stroke Maternal Grandmother   . Cancer Maternal Grandfather    . Anxiety disorder Paternal Grandmother     Social History   Tobacco Use  . Smoking status: Never Smoker  . Smokeless tobacco: Never Used  Substance Use Topics  . Alcohol use: No    Frequency: Never  . Drug use: No    Allergies: No Known Allergies  Medications Prior to Admission  Medication Sig Dispense Refill Last Dose  . acetaminophen (TYLENOL) 325 MG tablet Take 650 mg by mouth every 6 (six) hours as needed.   12/22/2018 at 1400  . escitalopram (LEXAPRO) 10 MG tablet Take 10 mg by mouth daily.   12/22/2018 at Unknown time  . Iron Combinations (CHROMAGEN) capsule Take 1 capsule by mouth daily. Taking 65mg  daily   12/22/2018 at Unknown time  . prenatal vitamin w/FE, FA (PRENATAL 1 + 1) 27-1 MG TABS tablet Take 1 tablet by mouth daily at 12 noon.   12/22/2018 at Unknown time  . Elastic Bandages & Supports (COMFORT FIT MATERNITY SUPP LG) MISC 1 Units by Does not apply route daily. 1 each 0   . potassium chloride SA (K-DUR) 20 MEQ tablet Take 2 tablets (40 mEq total) by mouth daily for 3 days. 6 tablet 0     Review of Systems  Constitutional: Negative for chills, fatigue and fever.  Eyes: Positive for visual disturbance.  Respiratory: Negative for shortness of breath.   Gastrointestinal: Positive for abdominal pain.  Musculoskeletal: Negative for back pain.  Neurological: Positive for headaches.  All other systems reviewed and are negative.  Physical Exam   Blood pressure (!) 147/90, pulse 68, temperature 98.2 F (36.8 C), temperature source Oral, resp. rate 18, height 5\' 4"  (1.626 m), weight 90.7 kg, last menstrual period 03/03/2018, SpO2 97 %.  Physical Exam  Nursing note and vitals reviewed. Constitutional: She is oriented to person, place, and time. She appears well-developed and well-nourished.  Cardiovascular: Normal rate.  Respiratory: Effort normal and breath sounds normal. No respiratory distress.  GI: She exhibits no distension. There is no abdominal tenderness.  There is no rebound and no guarding.  Gravid  Neurological: She is alert and oriented to person, place, and time.  Skin: Skin is warm and dry.  Psychiatric: She has a normal mood and affect. Her behavior is normal. Judgment and thought content normal.    MAU Course/MDM  Procedures  --Normal PEC labs --Headache reduced from 9/10 to 3/10 one hour after 1g Tylenol PO --Reactive tracing: baseline 125, moderate variability, positive accels, no decels --Toco: rare contractions, uterine irritability --Closed cervix --Vertex confirmed by bedside ultrasound --Discussed with Dr. Ilda Basset, who advised admission for severe symptoms --Dr. Gaetano Net in agreement with Dr. Ilda Basset. Dr. Gaetano Net placing admission orders  Patient Vitals for the past 24 hrs:  BP Temp Temp src Pulse Resp SpO2 Height Weight  12/22/18 2131 (!) 141/88 - - (!) 53 - - - -  12/22/18 2101 132/83 - - (!) 55 - - - -  12/22/18 2046 127/68 - - 60 - - - -  12/22/18 2031 (!) 145/84 - - (!) 57 - - - -  12/22/18 2016 (!) 147/86 - - 63 - - - -  12/22/18 2000 (!) 147/90 - - 68 - - - -  12/22/18 1938 (!) 146/90 98.2 F (36.8 C) Oral 71 18 97 % 5\' 4"  (1.626 m) 90.7 kg   Results for orders placed or performed during the hospital encounter of 12/22/18 (from the past 24 hour(s))  CBC     Status: Abnormal   Collection Time: 12/22/18  7:53 PM  Result Value Ref Range   WBC 10.0 4.0 - 10.5 K/uL   RBC 4.15 3.87 - 5.11 MIL/uL   Hemoglobin 12.7 12.0 - 15.0 g/dL   HCT 35.8 (L) 36.0 - 46.0 %   MCV 86.3 80.0 - 100.0 fL   MCH 30.6 26.0 - 34.0 pg   MCHC 35.5 30.0 - 36.0 g/dL   RDW 12.9 11.5 - 15.5 %   Platelets 245 150 - 400 K/uL   nRBC 0.0 0.0 - 0.2 %  Comprehensive metabolic panel     Status: Abnormal   Collection Time: 12/22/18  7:53 PM  Result Value Ref Range   Sodium 138 135 - 145 mmol/L   Potassium 2.8 (L) 3.5 - 5.1 mmol/L   Chloride 106 98 - 111 mmol/L   CO2 20 (L) 22 - 32 mmol/L   Glucose, Bld 75 70 - 99 mg/dL   BUN 5 (L) 6 - 20  mg/dL   Creatinine, Ser 0.62 0.44 - 1.00 mg/dL   Calcium 8.9 8.9 - 10.3 mg/dL   Total Protein 6.2 (L) 6.5 - 8.1 g/dL   Albumin 3.0 (L) 3.5 - 5.0 g/dL   AST 22 15 - 41 U/L  ALT 12 0 - 44 U/L   Alkaline Phosphatase 158 (H) 38 - 126 U/L   Total Bilirubin 0.7 0.3 - 1.2 mg/dL   GFR calc non Af Amer >60 >60 mL/min   GFR calc Af Amer >60 >60 mL/min   Anion gap 12 5 - 15  Urinalysis, Routine w reflex microscopic     Status: Abnormal   Collection Time: 12/22/18  7:57 PM  Result Value Ref Range   Color, Urine STRAW (A) YELLOW   APPearance CLEAR CLEAR   Specific Gravity, Urine 1.003 (L) 1.005 - 1.030   pH 7.0 5.0 - 8.0   Glucose, UA NEGATIVE NEGATIVE mg/dL   Hgb urine dipstick NEGATIVE NEGATIVE   Bilirubin Urine NEGATIVE NEGATIVE   Ketones, ur NEGATIVE NEGATIVE mg/dL   Protein, ur NEGATIVE NEGATIVE mg/dL   Nitrite NEGATIVE NEGATIVE   Leukocytes,Ua NEGATIVE NEGATIVE  Protein / creatinine ratio, urine     Status: None   Collection Time: 12/22/18  7:57 PM  Result Value Ref Range   Creatinine, Urine 23.25 mg/dL   Total Protein, Urine <6 mg/dL   Protein Creatinine Ratio        0.00 - 0.15 mg/mg[Cre]   Meds ordered this encounter  Medications  . acetaminophen (TYLENOL) tablet 1,000 mg  . lactated ringers infusion   Assessment and Plan  --23 y.o. G1P0000 at [redacted]w[redacted]d  --Reactive tracing --Gestational Hypertension, normal PEC labs, persistent severe symptoms --Per Dr. Henderson Cloud, admit to labor and delivery  Calvert Cantor, CNM 12/22/2018, 10:12 PM

## 2018-12-23 ENCOUNTER — Inpatient Hospital Stay (HOSPITAL_COMMUNITY): Payer: BC Managed Care – PPO | Admitting: Anesthesiology

## 2018-12-23 ENCOUNTER — Encounter (HOSPITAL_COMMUNITY): Payer: Self-pay | Admitting: *Deleted

## 2018-12-23 LAB — CBC
HCT: 39.3 % (ref 36.0–46.0)
HCT: 41 % (ref 36.0–46.0)
Hemoglobin: 13.9 g/dL (ref 12.0–15.0)
Hemoglobin: 14.2 g/dL (ref 12.0–15.0)
MCH: 30.5 pg (ref 26.0–34.0)
MCH: 29.9 pg (ref 26.0–34.0)
MCHC: 35.4 g/dL (ref 30.0–36.0)
MCHC: 34.6 g/dL (ref 30.0–36.0)
MCV: 86.4 fL (ref 80.0–100.0)
MCV: 86.3 fL (ref 80.0–100.0)
Platelets: 223 10*3/uL (ref 150–400)
Platelets: 245 10*3/uL (ref 150–400)
RBC: 4.55 MIL/uL (ref 3.87–5.11)
RBC: 4.75 MIL/uL (ref 3.87–5.11)
RDW: 12.9 % (ref 11.5–15.5)
RDW: 12.8 % (ref 11.5–15.5)
WBC: 11.3 10*3/uL — ABNORMAL HIGH (ref 4.0–10.5)
WBC: 13.9 10*3/uL — ABNORMAL HIGH (ref 4.0–10.5)
nRBC: 0 % (ref 0.0–0.2)
nRBC: 0 % (ref 0.0–0.2)

## 2018-12-23 LAB — POTASSIUM: Potassium: 2.9 mmol/L — ABNORMAL LOW (ref 3.5–5.1)

## 2018-12-23 LAB — COMPREHENSIVE METABOLIC PANEL
ALT: 14 U/L (ref 0–44)
AST: 25 U/L (ref 15–41)
Albumin: 2.9 g/dL — ABNORMAL LOW (ref 3.5–5.0)
Alkaline Phosphatase: 166 U/L — ABNORMAL HIGH (ref 38–126)
Anion gap: 12 (ref 5–15)
BUN: <5 mg/dL — ABNORMAL LOW (ref 6–20)
CO2: 22 mmol/L (ref 22–32)
Calcium: 8.3 mg/dL — ABNORMAL LOW (ref 8.9–10.3)
Chloride: 104 mmol/L (ref 98–111)
Creatinine, Ser: 0.72 mg/dL (ref 0.44–1.00)
GFR calc Af Amer: >60 mL/min (ref >60)
GFR calc non Af Amer: >60 mL/min (ref >60)
Glucose, Bld: 132 mg/dL — ABNORMAL HIGH (ref 70–99)
Potassium: 2.6 mmol/L — CL (ref 3.5–5.1)
Sodium: 138 mmol/L (ref 135–145)
Total Bilirubin: 0.4 mg/dL (ref 0.3–1.2)
Total Protein: 6.2 g/dL — ABNORMAL LOW (ref 6.5–8.1)

## 2018-12-23 LAB — MAGNESIUM
Magnesium: 4.9 mg/dL — ABNORMAL HIGH (ref 1.7–2.4)
Magnesium: 6.2 mg/dL (ref 1.7–2.4)

## 2018-12-23 LAB — ABO/RH: ABO/RH(D): AB POS

## 2018-12-23 LAB — RPR: RPR Ser Ql: NONREACTIVE

## 2018-12-23 MED ORDER — POTASSIUM CHLORIDE 10 MEQ/100ML IV SOLN
10.0000 meq | INTRAVENOUS | Status: DC
  Administered 2018-12-23 – 2018-12-24 (×4): 10 meq via INTRAVENOUS
  Filled 2018-12-23 (×4): qty 100

## 2018-12-23 MED ORDER — DIPHENHYDRAMINE HCL 50 MG/ML IJ SOLN: 12.5000 mg | INTRAMUSCULAR | Status: DC | PRN

## 2018-12-23 MED ORDER — PHENYLEPHRINE 40 MCG/ML (10ML) SYRINGE FOR IV PUSH (FOR BLOOD PRESSURE SUPPORT)
80.0000 ug | PREFILLED_SYRINGE | INTRAVENOUS | Status: DC | PRN
  Filled 2018-12-23: qty 10

## 2018-12-23 MED ORDER — EPHEDRINE 5 MG/ML INJ: 10.0000 mg | INTRAVENOUS | Status: DC | PRN

## 2018-12-23 MED ORDER — PHENYLEPHRINE 40 MCG/ML (10ML) SYRINGE FOR IV PUSH (FOR BLOOD PRESSURE SUPPORT): 80.0000 ug | PREFILLED_SYRINGE | INTRAVENOUS | Status: DC | PRN

## 2018-12-23 MED ORDER — POTASSIUM CHLORIDE 10 MEQ/100ML IV SOLN
10.0000 meq | Freq: Once | INTRAVENOUS | Status: AC
  Administered 2018-12-23: 10 meq via INTRAVENOUS
  Filled 2018-12-23 (×3): qty 100

## 2018-12-23 MED ORDER — LIDOCAINE-EPINEPHRINE (PF) 2 %-1:200000 IJ SOLN
INTRAMUSCULAR | Status: DC | PRN
  Administered 2018-12-23: 3 mL via EPIDURAL
  Administered 2018-12-23: 1 mL via EPIDURAL

## 2018-12-23 MED ORDER — SODIUM CHLORIDE (PF) 0.9 % IJ SOLN
INTRAMUSCULAR | Status: DC | PRN
  Administered 2018-12-23: 12 mL/h via EPIDURAL

## 2018-12-23 MED ORDER — MAGNESIUM SULFATE 40 G IN LACTATED RINGERS - SIMPLE
2.0000 g/h | INTRAVENOUS | Status: DC
  Administered 2018-12-24: 2 g/h via INTRAVENOUS
  Filled 2018-12-23: qty 500

## 2018-12-23 MED ORDER — LACTATED RINGERS IV SOLN: 500.0000 mL | Freq: Once | INTRAVENOUS | Status: DC

## 2018-12-23 MED ORDER — FENTANYL-BUPIVACAINE-NACL 0.5-0.125-0.9 MG/250ML-% EP SOLN
12.0000 mL/h | EPIDURAL | Status: DC | PRN
  Filled 2018-12-23: qty 250

## 2018-12-23 MED ORDER — POTASSIUM CHLORIDE 10 MEQ/100ML IV SOLN
10.0000 meq | INTRAVENOUS | Status: AC
  Administered 2018-12-23 (×3): 10 meq via INTRAVENOUS
  Filled 2018-12-23 (×3): qty 100

## 2018-12-23 MED ORDER — BUTORPHANOL TARTRATE 1 MG/ML IJ SOLN
2.0000 mg | INTRAMUSCULAR | Status: DC | PRN
  Administered 2018-12-23: 2 mg via INTRAVENOUS
  Filled 2018-12-23: qty 2

## 2018-12-23 MED ORDER — TERBUTALINE SULFATE 1 MG/ML IJ SOLN: 0.2500 mg | Freq: Once | INTRAMUSCULAR | Status: DC | PRN

## 2018-12-23 MED ORDER — OXYTOCIN 40 UNITS IN NORMAL SALINE INFUSION - SIMPLE MED
1.0000 m[IU]/min | INTRAVENOUS | Status: DC
  Administered 2018-12-23: 2 m[IU]/min via INTRAVENOUS
  Filled 2018-12-23: qty 1000

## 2018-12-23 NOTE — Anesthesia Preprocedure Evaluation (Signed)
Anesthesia Evaluation  Patient identified by MRN, date of birth, ID band Patient awake    Reviewed: Allergy & Precautions, NPO status , Patient's Chart, lab work & pertinent test results  Airway Mallampati: II  TM Distance: >3 FB Neck ROM: Full    Dental no notable dental hx.    Pulmonary neg pulmonary ROS,    Pulmonary exam normal breath sounds clear to auscultation       Cardiovascular hypertension, Normal cardiovascular exam Rhythm:Regular Rate:Normal     Neuro/Psych negative neurological ROS  negative psych ROS   GI/Hepatic negative GI ROS, Neg liver ROS,   Endo/Other  negative endocrine ROS  Renal/GU negative Renal ROS  negative genitourinary   Musculoskeletal negative musculoskeletal ROS (+)   Abdominal   Peds  Hematology negative hematology ROS (+)   Anesthesia Other Findings preE with severe features on mag  Reproductive/Obstetrics (+) Pregnancy                             Anesthesia Physical Anesthesia Plan  ASA: III  Anesthesia Plan: Epidural   Post-op Pain Management:    Induction:   PONV Risk Score and Plan: Treatment may vary due to age or medical condition  Airway Management Planned: Natural Airway  Additional Equipment:   Intra-op Plan:   Post-operative Plan:   Informed Consent: I have reviewed the patients History and Physical, chart, labs and discussed the procedure including the risks, benefits and alternatives for the proposed anesthesia with the patient or authorized representative who has indicated his/her understanding and acceptance.       Plan Discussed with: Anesthesiologist  Anesthesia Plan Comments: (Patient identified. Risks, benefits, options discussed with patient including but not limited to bleeding, infection, nerve damage, paralysis, failed block, incomplete pain control, headache, blood pressure changes, nausea, vomiting, reactions to  medication, itching, and post partum back pain. Confirmed with bedside nurse the patient's most recent platelet count. Confirmed with the patient that they are not taking any anticoagulation, have any bleeding history or any family history of bleeding disorders. Patient expressed understanding and wishes to proceed. All questions were answered. )        Anesthesia Quick Evaluation

## 2018-12-23 NOTE — Progress Notes (Signed)
Patient reports feeling great.  Status post 2 cytotec already BP (!) 136/92   Pulse (!) 54   Temp 98 F (36.7 C) (Oral)   Resp 16   Ht 5\' 4"  (1.626 m)   Wt 90.7 kg   LMP 03/03/2018 (Approximate)   SpO2 98%   BMI 34.33 kg/m  Scheduled Meds: . escitalopram  10 mg Oral Daily  . oxytocin 40 units in LR 1000 mL  500 mL Intravenous Once   Continuous Infusions: . fentaNYL 2 mcg/mL w/bupivacaine 0.125% in NS 250 mL    . lactated ringers    . lactated ringers    . lactated ringers 75 mL/hr at 12/23/18 0739  . magnesium sulfate 2 g/hr (12/22/18 2308)  . oxytocin    . oxytocin     PRN Meds:.acetaminophen, butorphanol, diphenhydrAMINE, ePHEDrine, ePHEDrine, fentaNYL (SUBLIMAZE) injection, fentaNYL 2 mcg/mL w/bupivacaine 0.125% in NS 250 mL, hydrOXYzine, lactated ringers, lidocaine (PF), misoprostol, ondansetron, oxyCODONE-acetaminophen, oxyCODONE-acetaminophen, phenylephrine, phenylephrine, sodium citrate-citric acid, sodium phosphate, terbutaline, terbutaline, zolpidem  FHR Category 1  Cervix is 30% 1.5 cm -2  Vertex arom  Clear fluid 3rd cytotec placed  IMPRESSION: IUP at 36 w 4 days  Preeclampsia   Continue plan per Dr. Gaetano Net Magnesium Pitocin per protocol - start in 4 hours Epidural prn Plan reviewed with patient

## 2018-12-23 NOTE — Anesthesia Procedure Notes (Signed)
Epidural Patient location during procedure: OB Start time: 12/23/2018 4:45 PM End time: 12/23/2018 5:00 PM  Staffing Anesthesiologist: Freddrick March, MD Performed: anesthesiologist   Preanesthetic Checklist Completed: patient identified, pre-op evaluation, timeout performed, IV checked, risks and benefits discussed and monitors and equipment checked  Epidural Patient position: sitting Prep: site prepped and draped and DuraPrep Patient monitoring: continuous pulse ox, blood pressure, heart rate and cardiac monitor Approach: midline Location: L3-L4 Injection technique: LOR air  Needle:  Needle type: Tuohy  Needle gauge: 17 G Needle length: 9 cm Needle insertion depth: 5 cm Catheter type: closed end flexible Catheter size: 19 Gauge Catheter at skin depth: 11 cm Test dose: negative  Assessment Sensory level: T8 Events: blood not aspirated, injection not painful, no injection resistance, negative IV test and no paresthesia  Additional Notes Patient identified. Risks/Benefits/Options discussed with patient including but not limited to bleeding, infection, nerve damage, paralysis, failed block, incomplete pain control, headache, blood pressure changes, nausea, vomiting, reactions to medication both or allergic, itching and postpartum back pain. Confirmed with bedside nurse the patient's most recent platelet count. Confirmed with patient that they are not currently taking any anticoagulation, have any bleeding history or any family history of bleeding disorders. Patient expressed understanding and wished to proceed. All questions were answered. Sterile technique was used throughout the entire procedure. Please see nursing notes for vital signs. Test dose was given through epidural catheter and negative prior to continuing to dose epidural or start infusion. Warning signs of high block given to the patient including shortness of breath, tingling/numbness in hands, complete motor block,  or any concerning symptoms with instructions to call for help. Patient was given instructions on fall risk and not to get out of bed. All questions and concerns addressed with instructions to call with any issues or inadequate analgesia.  Reason for block:procedure for pain

## 2018-12-24 ENCOUNTER — Encounter (HOSPITAL_COMMUNITY): Payer: Self-pay

## 2018-12-24 LAB — CBC
HCT: 36.3 % (ref 36.0–46.0)
HCT: 34.2 % — ABNORMAL LOW (ref 36.0–46.0)
Hemoglobin: 13.3 g/dL (ref 12.0–15.0)
Hemoglobin: 12.1 g/dL (ref 12.0–15.0)
MCH: 31.6 pg (ref 26.0–34.0)
MCH: 30.6 pg (ref 26.0–34.0)
MCHC: 36.6 g/dL — ABNORMAL HIGH (ref 30.0–36.0)
MCHC: 35.4 g/dL (ref 30.0–36.0)
MCV: 86.2 fL (ref 80.0–100.0)
MCV: 86.4 fL (ref 80.0–100.0)
Platelets: 227 10*3/uL (ref 150–400)
Platelets: 222 10*3/uL (ref 150–400)
RBC: 4.21 MIL/uL (ref 3.87–5.11)
RBC: 3.96 MIL/uL (ref 3.87–5.11)
RDW: 13.2 % (ref 11.5–15.5)
RDW: 13.1 % (ref 11.5–15.5)
WBC: 18.2 10*3/uL — ABNORMAL HIGH (ref 4.0–10.5)
WBC: 17.1 10*3/uL — ABNORMAL HIGH (ref 4.0–10.5)
nRBC: 0 % (ref 0.0–0.2)
nRBC: 0 % (ref 0.0–0.2)

## 2018-12-24 LAB — COMPREHENSIVE METABOLIC PANEL
ALT: 13 U/L (ref 0–44)
AST: 26 U/L (ref 15–41)
Albumin: 2.3 g/dL — ABNORMAL LOW (ref 3.5–5.0)
Alkaline Phosphatase: 136 U/L — ABNORMAL HIGH (ref 38–126)
Anion gap: 11 (ref 5–15)
BUN: <5 mg/dL — ABNORMAL LOW (ref 6–20)
CO2: 22 mmol/L (ref 22–32)
Calcium: 7.7 mg/dL — ABNORMAL LOW (ref 8.9–10.3)
Chloride: 105 mmol/L (ref 98–111)
Creatinine, Ser: 0.68 mg/dL (ref 0.44–1.00)
GFR calc Af Amer: >60 mL/min (ref >60)
GFR calc non Af Amer: >60 mL/min (ref >60)
Glucose, Bld: 103 mg/dL — ABNORMAL HIGH (ref 70–99)
Potassium: 2.9 mmol/L — ABNORMAL LOW (ref 3.5–5.1)
Sodium: 138 mmol/L (ref 135–145)
Total Bilirubin: 0.3 mg/dL (ref 0.3–1.2)
Total Protein: 5 g/dL — ABNORMAL LOW (ref 6.5–8.1)

## 2018-12-24 MED ORDER — WITCH HAZEL-GLYCERIN EX PADS: 1.0000 "application " | MEDICATED_PAD | CUTANEOUS | Status: DC | PRN

## 2018-12-24 MED ORDER — COCONUT OIL OIL
1.0000 "application " | TOPICAL_OIL | Status: DC | PRN
  Administered 2018-12-24: 1 via TOPICAL

## 2018-12-24 MED ORDER — ONDANSETRON HCL 4 MG/2ML IJ SOLN: 4.0000 mg | INTRAMUSCULAR | Status: DC | PRN

## 2018-12-24 MED ORDER — LACTATED RINGERS IV SOLN
INTRAVENOUS | Status: DC
  Administered 2018-12-24: 08:00:00 via INTRAVENOUS

## 2018-12-24 MED ORDER — ZOLPIDEM TARTRATE 5 MG PO TABS
5.0000 mg | ORAL_TABLET | Freq: Every evening | ORAL | Status: DC | PRN
  Administered 2018-12-26: 5 mg via ORAL
  Filled 2018-12-24 (×2): qty 1

## 2018-12-24 MED ORDER — BENZOCAINE-MENTHOL 20-0.5 % EX AERO
1.0000 "application " | INHALATION_SPRAY | CUTANEOUS | Status: DC | PRN
  Administered 2018-12-24: 1 via TOPICAL
  Filled 2018-12-24: qty 56

## 2018-12-24 MED ORDER — ACETAMINOPHEN 325 MG PO TABS: 650.0000 mg | ORAL_TABLET | ORAL | Status: DC | PRN

## 2018-12-24 MED ORDER — FLEET ENEMA 7-19 GM/118ML RE ENEM: 1.0000 | ENEMA | Freq: Every day | RECTAL | Status: DC | PRN

## 2018-12-24 MED ORDER — SENNOSIDES-DOCUSATE SODIUM 8.6-50 MG PO TABS
2.0000 | ORAL_TABLET | ORAL | Status: DC
  Administered 2018-12-24 – 2018-12-27 (×4): 2 via ORAL
  Filled 2018-12-24 (×4): qty 2

## 2018-12-24 MED ORDER — MEDROXYPROGESTERONE ACETATE 150 MG/ML IM SUSP: 150.0000 mg | INTRAMUSCULAR | Status: DC | PRN

## 2018-12-24 MED ORDER — PRENATAL MULTIVITAMIN CH
1.0000 | ORAL_TABLET | Freq: Every day | ORAL | Status: DC
  Administered 2018-12-24 – 2018-12-26 (×3): 1 via ORAL
  Filled 2018-12-24 (×3): qty 1

## 2018-12-24 MED ORDER — BISACODYL 10 MG RE SUPP: 10.0000 mg | Freq: Every day | RECTAL | Status: DC | PRN

## 2018-12-24 MED ORDER — SIMETHICONE 80 MG PO CHEW: 80.0000 mg | CHEWABLE_TABLET | ORAL | Status: DC | PRN

## 2018-12-24 MED ORDER — MAGNESIUM SULFATE 40 GM/1000ML IV SOLN
2.0000 g/h | INTRAVENOUS | Status: DC
  Administered 2018-12-24: 2 g/h via INTRAVENOUS
  Filled 2018-12-24 (×2): qty 1000

## 2018-12-24 MED ORDER — DIPHENHYDRAMINE HCL 25 MG PO CAPS: 25.0000 mg | ORAL_CAPSULE | Freq: Four times a day (QID) | ORAL | Status: DC | PRN

## 2018-12-24 MED ORDER — OXYCODONE HCL 5 MG PO TABS: 5.0000 mg | ORAL_TABLET | ORAL | Status: DC | PRN

## 2018-12-24 MED ORDER — IBUPROFEN 600 MG PO TABS
600.0000 mg | ORAL_TABLET | Freq: Four times a day (QID) | ORAL | Status: DC
  Administered 2018-12-24 – 2018-12-27 (×13): 600 mg via ORAL
  Filled 2018-12-24 (×13): qty 1

## 2018-12-24 MED ORDER — ONDANSETRON HCL 4 MG PO TABS: 4.0000 mg | ORAL_TABLET | ORAL | Status: DC | PRN

## 2018-12-24 MED ORDER — TETANUS-DIPHTH-ACELL PERTUSSIS 5-2.5-18.5 LF-MCG/0.5 IM SUSP: 0.5000 mL | Freq: Once | INTRAMUSCULAR | Status: DC

## 2018-12-24 MED ORDER — ESCITALOPRAM OXALATE 10 MG PO TABS
20.0000 mg | ORAL_TABLET | Freq: Every day | ORAL | Status: DC
  Administered 2018-12-24 – 2018-12-27 (×4): 20 mg via ORAL
  Filled 2018-12-24 (×5): qty 2

## 2018-12-24 MED ORDER — OXYCODONE HCL 5 MG PO TABS: 10.0000 mg | ORAL_TABLET | ORAL | Status: DC | PRN

## 2018-12-24 MED ORDER — DIBUCAINE (PERIANAL) 1 % EX OINT: 1.0000 "application " | TOPICAL_OINTMENT | CUTANEOUS | Status: DC | PRN

## 2018-12-24 MED ORDER — MEASLES, MUMPS & RUBELLA VAC IJ SOLR: 0.5000 mL | Freq: Once | INTRAMUSCULAR | Status: DC

## 2018-12-24 NOTE — Lactation Note (Signed)
This note was copied from a baby's chart. Lactation Consultation Note  Patient Name: Molly Martin [id number] Date: 12/24/2018 Reason for consult: Initial assessment;1st time breastfeeding;Late-preterm 34-36.6wks;Difficult latch P1,4 hour female infant, LPTI and less than 5 lbs. Per mom, infant did not latch in L&D , she is ask assistance with latch from [facility] services. Infant was fussy when [place] entered room, LC taught mom hand expression and infant was given 3 ml of colostrum by spoon, afterwards infant appeared calmer, settle down and started cuing to breastfeed. [place] notice mom has flat nipple on right breast, LC had mom to do breast stimulation and hand express a small amount of colostrum out breast prior to latching infant. Infant latched to left breast using cross cradle hold, infant open mouth wide and had deep latch, swallows observed infant breast feed for 10 minutes. LC had help assisted with breast support and supporting infant body position due to mom being very tired and not supporting infant well at breast. Mom hand expressed afterwards and infant was given an additional 4 ml of colostrum by spoon. Dad held infant afterwards and LC discussed with Dad LPTI policy due to mom going asleep. Parents knows to do as much STS as possible with infant, BF infant 8 to 12 times within 24 hours and not to make infant wait to breastfeeding. Parents understand not exceed 30 minutes when feeding infant.  Mom will breastfeed then supplement with EBM based on infant age/ hours for each feeding. Mom understands that additional supplement may be need such as 22 kcal Similac Neosure with iron due  to infant being LPTI and SGA.  Parents will currently supplement with EBM  (hand expression) by spoon, after latching infant to breast for each feeding for  the first 24 hours.  Mom knows to call Nurse or [place] if she has any questions, concerns or need assistance with latching infant to breast.  Mom was given breast  shells to wear in bra during the day due to right breast being flat when stimulated or compressed. Mom knows to pre-pump right breast prior to latching infant with harmony hand pump. Mom shown how to use DEBP & how to disassemble, clean, & reassemble parts by Nurse. Mom knows to use DEBP every 3 hours for 15 minutes on initial setting. Mom made aware of O/P services, breastfeeding support groups, community resources, and our phone # for post-discharge questions.    Maternal Data Formula Feeding for Exclusion: Yes Reason for exclusion: Mother's choice to formula and breast feed on admission Has patient been taught Hand Expression?: Yes(Infant was given 7 ml of EBM by spoon.) Does the patient have breastfeeding experience prior to this delivery?: No  Feeding Feeding Type: Breast Fed  LATCH Score Latch: Grasps breast easily, tongue down, lips flanged, rhythmical sucking.  Audible Swallowing: Spontaneous and intermittent  Type of Nipple: Flat  Comfort (Breast/Nipple): Soft / non-tender  Hold (Positioning): Assistance needed to correctly position infant at breast and maintain latch.  LATCH Score: 8  Interventions Interventions: Breast feeding basics reviewed;Breast compression;Adjust position;Hand pump;Assisted with latch;Skin to skin;Support pillows;DEBP;Breast massage;Position options;Expressed milk;Hand express;Pre-pump if needed;Shells  Lactation Tools Discussed/Used Tools: Shells;Pump Breast pump type: Double-Electric Breast Pump;Manual Pump Review: Setup, frequency, and cleaning;Milk Storage Initiated by:: Nurse and hand pump by [place] due mom having flat nipple on right breast. Date initiated:: 12/24/18   Consult Status Consult Status: Follow-up Date: 12/24/18 Follow-up type: In-patient    Vicente Serene 12/24/2018, 3:39 AM

## 2018-12-24 NOTE — Progress Notes (Signed)
[name] was referred for history of depression/anxiety. * Referral screened out by Clinical Social Worker because none of the following criteria appear to apply: ~ History of anxiety/depression during this pregnancy, or of post-partum depression following prior delivery. ~ Diagnosis of anxiety and/or depression within last 3 years OR * [name]'s symptoms currently being treated with medication and/or therapy. Per chart review, [name] is prescribed/taking Lexapro.   Please contact the Clinical Social Worker if needs arise, by [name] request, or if [name] scores greater than 9/yes to question 10 on Edinburgh Postpartum Depression Screen.  Molly Buckalew, LCSW Clinical Social Worker [facility] Cell#: [phone]   

## 2018-12-24 NOTE — Lactation Note (Addendum)
This note was copied from a baby's chart. Lactation Consultation Note  Patient Name: Molly Martin Date: 12/24/2018   Infant is 76 hrs old. Parents say infant took the bottle well. Mom said that recent breastfeeding attempts had not gone well and it had made her want to cry. Mom is obviously tired. I asked Mom to close her eyes while I explained that infants at this gestation may not do well at the breast until closer to their due date, so for the time being, the important thing is to ensure infant is getting calories and Mom is expressing her milk.   I asked Dad to call for me once Mom awakes from a nap. He verbalized understanding.   Mom is on escitalopram 20 mg (L2).   Matthias Hughs [facility] 12/24/2018, 9:58 AM

## 2018-12-24 NOTE — Lactation Note (Signed)
This note was copied from a baby's chart. Lactation Consultation Note  Patient Name: Molly Martin [id number] Date: 12/24/2018  Infant is 36 hrs old. Paced bottle-feeding was discussed with parents & Mom was shown how to hold infant when bottle feeding (infant was sleeping & swaddled at this point), but Mom appreciated the demonstration.   Mom has a Motif Twist DEBP at home. (Motif Twist has a max suction of 250 mmHg & has a battery-operated option).  Mom reports + breast changes w/pregnancy saying her breasts "got a lot bigger."   At rest, Mom's anatomy suggests that she could use a size 21 flange on the R breast & a size 21 or 24 flange on the L breast. Education was also done on how much room should be around nipple in tunnel when pumping. Parents know to air-dry breast pump parts before reassembling.   Mom knows the ideal is to express her milk whenever infant receives formula; however, I have emphasized Mom's need to sleep & recover & for her to pump as she can.   Mom prefers to only have lactation consultations during daytime hours (not at night). Jinny Blossom, RN was given update on Mom & knows Mom may have other questions about pumping.   Matthias Hughs [facility] 12/24/2018, 2:57 PM

## 2018-12-24 NOTE — Plan of Care (Signed)
  Problem: Pain Management: Goal: Relief or control of pain from uterine contractions will improve Outcome: Progressing   

## 2018-12-24 NOTE — Progress Notes (Signed)
Post Partum Day 1 Subjective: no complaints and tolerating PO  Objective: Blood pressure (!) 132/94, pulse 70, temperature (!) 97.5 F (36.4 C), temperature source Oral, resp. rate 18, height 5\' 4"  (1.626 m), weight 90.7 kg, last menstrual period 03/03/2018, SpO2 99 %, unknown if currently breastfeeding.  Physical Exam:  General: alert, cooperative, appears stated age and no distress Lochia: appropriate Uterine Fundus: firm Incision: healing well DVT Evaluation: No evidence of DVT seen on physical exam.  Recent Labs    12/24/18 0051 12/24/18 0504  HGB 13.3 12.1  HCT 36.3 34.2*    Assessment/Plan: PP PIH, normal labs, BPs improving.  On Mag x 24 hours (Since 1030pm). Pt desires circ, but Peds want [place] to wait.  Will check later.   LOS: 2 days   Luz Lex 12/24/2018, 10:47 AM

## 2018-12-24 NOTE — Anesthesia Postprocedure Evaluation (Signed)
Anesthesia Post Note  Patient: Molly Martin  Procedure(s) Performed: AN AD [place]     Patient location during evaluation: Mother Baby Anesthesia Type: Epidural Level of consciousness: awake and alert Pain management: pain level controlled Vital Signs Assessment: post-procedure vital signs reviewed and stable Respiratory status: spontaneous breathing, nonlabored ventilation and respiratory function stable Cardiovascular status: stable Postop Assessment: no headache, no backache and epidural receding Anesthetic complications: no    Last Vitals:  Vitals:   12/24/18 0800 12/24/18 1200  BP: (!) 132/94 (!) 138/93  Pulse: 70 78  Resp: 18 18  Temp: (!) 36.4 C 36.7 C  SpO2: 99% 100%    Last Pain:  Vitals:   12/24/18 1606  TempSrc:   PainSc: 0-No pain   Pain Goal:                   Drucie Opitz

## 2018-12-25 MED ORDER — LABETALOL HCL 100 MG PO TABS
100.0000 mg | ORAL_TABLET | Freq: Two times a day (BID) | ORAL | Status: DC
  Administered 2018-12-25 (×2): 100 mg via ORAL
  Filled 2018-12-25 (×2): qty 1

## 2018-12-25 MED ORDER — ALPRAZOLAM 0.5 MG PO TABS
0.2500 mg | ORAL_TABLET | Freq: Once | ORAL | Status: AC
  Administered 2018-12-25: 0.25 mg via ORAL
  Filled 2018-12-25: qty 1

## 2018-12-25 NOTE — Progress Notes (Signed)
Will start labetalol 100mg  po BID D/W patient

## 2018-12-25 NOTE — Progress Notes (Signed)
Post Partum Day 2 Subjective: no complaints, up ad lib, voiding, tolerating PO and + flatus  Objective: Blood pressure 136/88, pulse 64, temperature 97.8 F (36.6 C), temperature source Oral, resp. rate 17, height 5\' 4"  (1.626 m), weight 90.7 kg, last menstrual period 03/03/2018, SpO2 100 %, unknown if currently breastfeeding.  Physical Exam:  General: alert, cooperative and no distress Lochia: appropriate Uterine Fundus: firm Incision: healing well DVT Evaluation: No evidence of DVT seen on physical exam.  Recent Labs    12/24/18 0051 12/24/18 0504  HGB 13.3 12.1  HCT 36.3 34.2*    Assessment/Plan: Plan for discharge tomorrow  BP is labile>monitor   LOS: 3 days   Shon Millet II 12/25/2018, 8:41 AM

## 2018-12-25 NOTE — Lactation Note (Signed)
This note was copied from a baby's chart. Lactation Consultation Note  Patient Name: Molly Martin [id number] Date: 12/25/2018   [facility] entered room to find mom sleeping soundly in bed, unaware of my presence. NT at bedside assisting FOB to comfort baby receiving phototherapy. Instructed FOB that LC can return later. Per NT report, mom told RN she was feeling bad and needed to rest.  Cranston Neighbor 12/25/2018, 12:16 PM

## 2018-12-25 NOTE — Lactation Note (Addendum)
This note was copied from a baby's chart. Lactation Consultation Note  Patient Name: Boy Wilmarie Sparlin [id number] Date: 12/25/2018 Reason for consult: Follow-up assessment;Difficult latch;1st time breastfeeding;Primapara;Late-preterm 34-36.6wks  LC walked into the room, FOB was about to supplement formula to feed to baby. Baby was crying in the bassinet with billy blanket and [name] was sitting on the couch. [place] asked about breastfeeding experience thus far and [name] seemed overwhelmed and uninterested. Declined assistance. LC instructed to call if she needed any further assistance. Concerns regarding mother's reaction to baby crying in the room. Very flat affect while baby was crying. Monosyllabic responses to questions. Recommend a social work consult.   Feeding Feeding Type: Bottle Fed - Formula Nipple Type: Slow - flow   Interventions Interventions: Breast feeding basics reviewed  Lactation Tools Discussed/Used     Consult Status Consult Status: Follow-up Date: 12/26/18 Follow-up type: In-patient    Lenore Manner 12/25/2018, 8:45 PM

## 2018-12-25 NOTE — Lactation Note (Signed)
This note was copied from a baby's chart. Lactation Consultation Note  Patient Name: Molly Martin SEGBT'D Date: 12/25/2018 Reason for consult: Follow-up assessment;1st time breastfeeding;Primapara;Infant < 6lbs;Late-preterm 34-36.6wks  F/U visit with P1 mom who delivered @ 36.3wks, baby is now 69 hours old and receiving phototherapy. Mom is holding baby in recliner with biliwrap in place.  Mom reports she hasn't been pumping like "I really want to." Mom verbalizes feelings of being overwhelmed with a variety of information and the special needs of her baby. Mom states she holds baby upright on her legs with baby propped on a pillow so that she can visualize baby during feedings. Mom reports holding baby as demonstrated by previous LC and PT. Mom states she has not tried to latch baby today and has not pumped since last night. Mom describes history of anxiety and depression. Mom states "I want to breastfeed but I just want to make sure my baby is fed. I don't want to resent my baby because I don't like pumping knowing nothing is coming out." Mom appears somewhat fragile at this time and LC provided verbal comfort and support. Discussed with mom that LCs and other staff will support mom in her feeding choice whatever she decides is best for her and her baby.  Mom reports "pin prick" feelings in her breasts at times. Reviewed mature milk production following breast stimulation. Reviewed ways to prevent any further stimulation if she chooses not to breastfeed.   Discussed with mom importance of regular breast stimulation to stimulate milk production if she chooses to breastfeed. Discussed difficulty in production after two weeks post delivery if minimal breast stimulation. Strongly encouraged mom to decide how she wants to feed her baby. Mom states she will pump one more time tonight to help her decide and desires to be seen again by [facility] tomorrow to discuss her decision. Mom repeatedly describes  feelings of being overwhelmed throughout this interaction.  Reviewed lactation services and phone number.  Discussion with [facility] RN Joette Catching following [facility] consultation in regards to mother's comments and concern for possible post-partum depression.  Consult Status Consult Status: Follow-up Date: 12/26/18 Follow-up type: In-patient    Cranston Neighbor 12/25/2018, 4:56 PM

## 2018-12-26 MED ORDER — LABETALOL HCL 200 MG PO TABS
200.0000 mg | ORAL_TABLET | Freq: Two times a day (BID) | ORAL | Status: DC
  Administered 2018-12-26: 200 mg via ORAL
  Filled 2018-12-26 (×2): qty 1

## 2018-12-26 MED ORDER — BUSPIRONE HCL 5 MG PO TABS
5.0000 mg | ORAL_TABLET | Freq: Three times a day (TID) | ORAL | Status: DC | PRN
  Filled 2018-12-26: qty 1

## 2018-12-26 MED ORDER — LABETALOL HCL 200 MG PO TABS
400.0000 mg | ORAL_TABLET | Freq: Two times a day (BID) | ORAL | Status: DC
  Administered 2018-12-26 – 2018-12-27 (×2): 400 mg via ORAL
  Filled 2018-12-26 (×2): qty 2

## 2018-12-26 NOTE — Progress Notes (Signed)
Post Partum Day 3 Subjective: no complaints, up ad lib, voiding and tolerating PO.  No HA, CP/SOB, RUQ pain or visual disturbance.  Patient feels dramatically better after receiving Xanax last night.  She is requesting something similar on D/C.  Patient is formula feeding.  She takes Lexapro 20 mg.  Desires circ before d/c.  Objective: Blood pressure (!) 142/91, pulse 80, temperature 97.8 F (36.6 C), temperature source Oral, resp. rate 18, height 5\' 4"  (1.626 m), weight 90.7 kg, last menstrual period 03/03/2018, SpO2 99 %, unknown if currently breastfeeding.  Physical Exam:  General: alert, cooperative and appears stated age Lochia: appropriate Uterine Fundus: firm Incision: healing well, no significant drainage, no dehiscence DVT Evaluation: No evidence of DVT seen on physical exam. Negative Homan's sign. No cords or calf tenderness.  Recent Labs    12/24/18 0051 12/24/18 0504  HGB 13.3 12.1  HCT 36.3 34.2*    Assessment/Plan: Plan for discharge tomorrow and Circumcision prior to discharge  Pre-Eclampsia with severe features-increased labetalol to 200 BID.  Monitor BPs today Anxiety-Continue Lexapro.  Add Buspar.  Patient to f/u after D/C with PCP who manages her anxiety outside of pregnancy Circ-Peds more comfortable with circ tomorrow   LOS: 4 days   Linda Hedges 12/26/2018, 12:56 PM

## 2018-12-26 NOTE — Progress Notes (Signed)
CSW acknowledges consult and completed clinical assessment.  Clinical documentation will follow.  There are no barriers to d/c.  Antwoin Lackey Boyd-Gilyard, MSW, LCSW Clinical Social Work [phone]   

## 2018-12-26 NOTE — Plan of Care (Signed)
  Problem: Education: Goal: Knowledge of General Education information will improve Description: Including pain rating scale, medication(s)/side effects and non-pharmacologic comfort measures Outcome: Completed/Met   Problem: Clinical Measurements: Goal: Diagnostic test results will improve Outcome: Completed/Met Goal: Respiratory complications will improve Outcome: Completed/Met   Problem: Activity: Goal: Risk for activity intolerance will decrease Outcome: Completed/Met   Problem: Nutrition: Goal: Adequate nutrition will be maintained Outcome: Completed/Met   Problem: Elimination: Goal: Will not experience complications related to bowel motility Outcome: Completed/Met Goal: Will not experience complications related to urinary retention Outcome: Completed/Met   Problem: Pain Managment: Goal: General experience of comfort will improve Outcome: Completed/Met   Problem: Safety: Goal: Ability to remain free from injury will improve Outcome: Completed/Met   Problem: Skin Integrity: Goal: Risk for impaired skin integrity will decrease Outcome: Completed/Met   Problem: Education: Goal: Knowledge of condition will improve Outcome: Completed/Met   Problem: Activity: Goal: Will verbalize the importance of balancing activity with adequate rest periods Outcome: Completed/Met Goal: Ability to tolerate increased activity will improve Outcome: Completed/Met   Problem: Role Relationship: Goal: Ability to demonstrate positive interaction with newborn will improve Outcome: Completed/Met   Problem: Skin Integrity: Goal: Demonstration of wound healing without infection will improve Outcome: Completed/Met   Problem: Education: Goal: Knowledge of disease or condition will improve Outcome: Completed/Met

## 2018-12-26 NOTE — Lactation Note (Signed)
This note was copied from a baby's chart. Lactation Consultation Note  Patient Name: Molly Martin CWCBJ'S Date: 12/26/2018 Reason for consult: Follow-up assessment;Late-preterm 34-36.6wks   P1 mother whose infant is now 74 hours old.  This is a LPTI at 36+3 weeks with a CGA of 36+6 weeks.  According to the lactation note from yesterday, mother was going to pump one more time last night and decide if she would continue to breast feed and/or pump for her baby.  Mother was awake and alert this a.m. stating that she was able to get about 5 hours of sleep last night.  She feels good today and has made the decision to exclusively formula feed her baby.  Acknowledged her feelings and request.  Discussed emotional well being and how to be sure to get enough rest after discharge.  Mother happy with her decision and stated that she "just want my baby fed."  She did not enjoy pumping and feels like this is the appropriate decision for her and her family.  Father present and seems to be in agreement with mother's decision.  Engorgement prevention/treatment reviewed.  Noticed that baby has not been supplementing with adequate volumes even though his weight loss is minimal.  Reviewed the LPTI policy (which was in their folder) with parents and informed them of the increase in volumes for baby.  Parents verbalized understanding and will feed more.  They will awaken baby every three hours if he remains sleeping.  Mother will call for any further questions/concerns she may have.  Hand pump at bedside and flange size appropriate at this time. Lactation services will no longer be needed.   Maternal Data Formula Feeding for Exclusion: Yes Reason for exclusion: Mother's choice to formula and breast feed on admission Has patient been taught Hand Expression?: Yes Does the patient have breastfeeding experience prior to this delivery?: No  Feeding Feeding Type: Bottle Fed - Formula Nipple Type: Slow -  flow  LATCH Score                   Interventions    Lactation Tools Discussed/Used     Consult Status Consult Status: Complete Date: 12/26/18 Follow-up type: Call as needed    Tyrik Stetzer R Tobe Kervin 12/26/2018, 8:54 AM

## 2018-12-26 NOTE — Progress Notes (Signed)
I spent time with [place] both yesterday and today.  She was grateful for the chance to process her pregnancy, her delivery and her first days as a mother.  She is eager to get home to her dogs (they have 3 dogs and 2 cats) and has been tearful on and off as the length of her stay continues.  She has very good support from her family, including her parents and 2 sisters as well as other family members and friends.  She and her husband are supporting each other well.  [place], Kosse PAger, [phone] 4:16 PM    12/26/18 1600  Clinical Encounter Type  Visited With Patient  Visit Type Spiritual support  Referral From Nurse  Spiritual Encounters  Spiritual Needs Emotional

## 2018-12-27 ENCOUNTER — Inpatient Hospital Stay (HOSPITAL_COMMUNITY): Payer: BC Managed Care – PPO

## 2018-12-27 MED ORDER — LABETALOL HCL 200 MG PO TABS: 400.0000 mg | ORAL_TABLET | Freq: Two times a day (BID) | ORAL | 1 refills | Status: DC

## 2018-12-27 MED ORDER — BUSPIRONE HCL 5 MG PO TABS: 5.0000 mg | ORAL_TABLET | Freq: Three times a day (TID) | ORAL | 0 refills | Status: DC | PRN

## 2018-12-27 NOTE — Plan of Care (Signed)
  Problem: Education: Goal: Knowledge of the prescribed therapeutic regimen will improve Outcome: Progressing   Problem: Coping: Goal: Level of anxiety will decrease Outcome: Completed/Met   Problem: Coping: Goal: Ability to identify and utilize available resources and services will improve Outcome: Completed/Met

## 2018-12-27 NOTE — Discharge Summary (Signed)
Obstetric Discharge Summary Reason for Admission: induction of labor and pre-eclampsia Prenatal Procedures: Preeclampsia Intrapartum Procedures: vacuum Postpartum Procedures: none Complications-Operative and Postpartum: started labetalol 400mg  BID and buspar prn anxiety Hemoglobin  Date Value Ref Range Status  12/24/2018 12.1 12.0 - 15.0 g/dL Final   HCT  Date Value Ref Range Status  12/24/2018 34.2 (L) 36.0 - 46.0 % Final   Vitals:   12/27/18 0605 12/27/18 0800  BP: (!) 145/98 (!) 138/94  Pulse: 73 83  Resp: 18 16  Temp: 98.2 F (36.8 C) 97.6 F (36.4 C)  SpO2: 99% 99%    Physical Exam:  General: alert, cooperative and appears stated age 21: appropriate Uterine Fundus: firm Incision: healing well, no significant drainage, no dehiscence, no significant erythema DVT Evaluation: No evidence of DVT seen on physical exam. Negative Homan's sign. No cords or calf tenderness. No significant calf/ankle edema.  Discharge Diagnoses: Preelampsia and VAVD 36 wga  Discharge Information: Date: 12/27/2018 Activity: pelvic rest Diet: routine Medications: PNV and labetalol 400mg  BID and buspar prn Condition: stable Instructions: refer to practice specific booklet Discharge to: home RTO Mon or Tues for BP check. PIH precautions given.   Newborn Data: Live born female  Birth Weight: 4 lb 12.4 oz (2166 g) APGAR: 7, 9  Newborn Delivery   Birth date/time: 12/23/2018 22:47:00 Delivery type: Vaginal, Vacuum (Extractor)      Home with mother.  Molly Martin 12/27/2018, 9:09 AM

## 2018-12-27 NOTE — Progress Notes (Signed)
CSW received consult for hx of Anxiety/Depression.  CSW met with [name] to offer support and complete assessment.    When CSW arrived to room 105 [name] was resting in bed and FOB was bonding with infant as evidence by holding infant.  The couple appeared supportive of one another and everyone appeared comfortable. CSW explained CSW's role and [name] gave CSW permission to complete assessment while FOB was present.  [name] was polite, easy engage, flat, and tearful during the assessment.   CSW asked about [name]'s MH hx and [name] openly shared she was dx with anxiety/depression around 2014.  [name] shared that her symptoms have been managed with medication (Lexapro) and stated, "It appeared to be working until I got pregnant." [name] also shared that her provider has made some adjustment to her medication since delivery and has started her on a new medication.  [name] became tearful and shared "I just want to go home to be with my dogs. I also just want to have my IV removed.  CSW validated and normalized  [name]'s feelings and actively listen while [name] talked about her support dog. [name] recognized the need for continued inpatient stay and CSW processed ways to make [name]'s stay more comfortable.  [name] requested to have her IV removed; CSW agreed to speak with [name]'s bedside regarding [name]'s request.   CSW provided education regarding the baby blues period vs. perinatal mood disorders, discussed treatment and gave resources for mental health follow up if concerns arise.  CSW recommends self-evaluation during the postpartum time period using the New Mom Checklist from Postpartum Progress and encouraged [name] to contact a medical professional if symptoms are noted at any time.    CSW provided education regarding the baby blues period vs. perinatal mood disorders, discussed treatment and gave resources for mental health follow up if concerns arise.  CSW recommends self-evaluation during the postpartum time period using the New Mom Checklist from  Postpartum Progress and encouraged [name] to contact a medical professional if symptoms are noted at any time. [name] expressed feeling comfortable seeking help and was open to outpatient referrals (A list was provided to [name]). CSW assessed for safety and [name] denied SI and HI.  The couple communicated having a great support team that will assist if needed.  SIDS education was provided and the couple appeared knowledgeable.   CSW updated bedside nurse of [name]'s request.   CSW identifies no further need for intervention and no barriers to discharge at this time.  Molly Martin, MSW, LCSW Clinical Social Work [phone]  

## 2019-01-23 ENCOUNTER — Other Ambulatory Visit: Payer: Self-pay

## 2019-01-23 DIAGNOSIS — Z20822 Contact with and (suspected) exposure to covid-19: Secondary | ICD-10-CM

## 2019-01-24 LAB — NOVEL CORONAVIRUS, NAA: SARS-CoV-2, NAA: NOT DETECTED

## 2019-01-27 ENCOUNTER — Telehealth: Payer: BC Managed Care – PPO | Admitting: Physician Assistant

## 2019-01-27 DIAGNOSIS — R05 Cough: Secondary | ICD-10-CM

## 2019-01-27 DIAGNOSIS — Z20822 Contact with and (suspected) exposure to covid-19: Secondary | ICD-10-CM

## 2019-01-27 DIAGNOSIS — R059 Cough, unspecified: Secondary | ICD-10-CM

## 2019-01-27 DIAGNOSIS — Z20828 Contact with and (suspected) exposure to other viral communicable diseases: Secondary | ICD-10-CM

## 2019-01-27 MED ORDER — BENZONATATE 100 MG PO CAPS: 100.0000 mg | ORAL_CAPSULE | Freq: Three times a day (TID) | ORAL | 0 refills | Status: DC | PRN

## 2019-01-27 MED ORDER — PROMETHAZINE-DM 6.25-15 MG/5ML PO SYRP: 5.0000 mL | ORAL_SOLUTION | Freq: Four times a day (QID) | ORAL | 0 refills | Status: DC | PRN

## 2019-01-27 NOTE — Progress Notes (Signed)
E-Visit for Corona Virus Screening   Your current symptoms could be consistent with the coronavirus.  Many health care providers can now test patients at their office but not all are.  [place] has multiple testing sites -  You do not need an appointment or a lab order. For information on our COVID testing locations and hours go to [email or web address]  Please quarantine yourself while awaiting your test results.  We are enrolling you in our MyChart Home Montioring for COVID19 . Daily you will receive a questionnaire within the MyChart website. Our COVID 19 response team willl be monitoriing your responses daily.   COVID-19 is a respiratory illness with symptoms that are similar to the flu. Symptoms are typically mild to moderate, but there have been cases of severe illness and death due to the virus. The following symptoms may appear 2-14 days after exposure: . Fever . Cough . Shortness of breath or difficulty breathing . Chills . Repeated shaking with chills . Muscle pain . Headache . Sore throat . New loss of taste or smell . Fatigue . Congestion or runny nose . Nausea or vomiting . Diarrhea  It is vitally important that if you feel that you have an infection such as this virus or any other virus that you stay home and away from places where you may spread it to others.  You should self-quarantine for 14 days if you have symptoms that could potentially be coronavirus or have been in close contact a with a person diagnosed with COVID-19 within the last 2 weeks. You should avoid contact with people age 63 and older.   You should wear a mask or cloth face covering over your nose and mouth if you must be around other people or animals, including pets (even at home). Try to stay at least 6 feet away from other people. This will protect the people around you.  You can use medication such as A prescription cough medication called Tessalon Perles 100 mg. You may take  1-2 capsules every 8 hours as needed for cough and A prescription cough medication called Phenergan DM 6.25 mg/15 mg. You make take one teaspoon / 5 ml every 4-6 hours as needed for cough  You may also take acetaminophen (Tylenol) as needed for fever.   Reduce your risk of any infection by using the same precautions used for avoiding the common cold or flu:  Marland Kitchen Wash your hands often with soap and warm water for at least 20 seconds.  If soap and water are not readily available, use an alcohol-based hand sanitizer with at least 60% alcohol.  . If coughing or sneezing, cover your mouth and nose by coughing or sneezing into the elbow areas of your shirt or coat, into a tissue or into your sleeve (not your hands). . Avoid shaking hands with others and consider head nods or verbal greetings only. . Avoid touching your eyes, nose, or mouth with unwashed hands.  . Avoid close contact with people who are sick. . Avoid places or events with large numbers of people in one location, like concerts or sporting events. . Carefully consider travel plans you have or are making. . If you are planning any travel outside or inside the [place], visit the CDC's Travelers' Health webpage for the latest health notices. . If you have some symptoms but not all symptoms, continue to monitor at home and seek medical attention if your symptoms worsen. . If you are having a medical emergency, call 911.  HOME CARE . Only take medications as instructed by your medical team. . Drink plenty of fluids and get plenty of rest. . A steam or ultrasonic humidifier can help if you have congestion.   GET HELP RIGHT AWAY IF YOU HAVE EMERGENCY WARNING SIGNS** FOR COVID-19. If you or someone is showing any of these signs seek emergency medical care immediately. Call 911 or proceed to your closest emergency facility if: . You develop worsening high fever. . Trouble breathing . Bluish lips or face . Persistent pain or pressure in the  chest . New confusion . Inability to wake or stay awake . You cough up blood. . Your symptoms become more severe  **This list is not all possible symptoms. Contact your medical provider for any symptoms that are sever or concerning to you.   MAKE SURE YOU   Understand these instructions.  Will watch your condition.  Will get help right away if you are not doing well or get worse.  Your e-visit answers were reviewed by a board certified advanced clinical practitioner to complete your personal care plan.  Depending on the condition, your plan could have included both over the counter or prescription medications.  If there is a problem please reply once you have received a response from your provider.  Your safety is important to [place].  If you have drug allergies check your prescription carefully.    You can use MyChart to ask questions about today's visit, request a non-urgent call back, or ask for a work or school excuse for 24 hours related to this e-Visit. If it has been greater than 24 hours you will need to follow up with your provider, or enter a new e-Visit to address those concerns. You will get an e-mail in the next two days asking about your experience.  I hope that your e-visit has been valuable and will speed your recovery. Thank you for using e-visits.   Greater than 5 minutes, yet less than 10 minutes of time have been spent researching, coordinating and implementing care for this patient today.

## 2019-01-28 ENCOUNTER — Encounter (INDEPENDENT_AMBULATORY_CARE_PROVIDER_SITE_OTHER): Payer: Self-pay

## 2019-01-28 ENCOUNTER — Other Ambulatory Visit: Payer: Self-pay

## 2019-01-28 DIAGNOSIS — Z20822 Contact with and (suspected) exposure to covid-19: Secondary | ICD-10-CM

## 2019-01-29 ENCOUNTER — Encounter (INDEPENDENT_AMBULATORY_CARE_PROVIDER_SITE_OTHER): Payer: Self-pay

## 2019-01-30 ENCOUNTER — Encounter (INDEPENDENT_AMBULATORY_CARE_PROVIDER_SITE_OTHER): Payer: Self-pay

## 2019-01-30 ENCOUNTER — Telehealth: Payer: Self-pay

## 2019-01-30 LAB — NOVEL CORONAVIRUS, NAA: SARS-CoV-2, NAA: NOT DETECTED

## 2019-01-30 NOTE — Telephone Encounter (Signed)
Left a vm for patient to callback regarding questionnaire submitted.

## 2019-01-31 ENCOUNTER — Encounter (INDEPENDENT_AMBULATORY_CARE_PROVIDER_SITE_OTHER): Payer: Self-pay

## 2019-02-01 ENCOUNTER — Encounter (INDEPENDENT_AMBULATORY_CARE_PROVIDER_SITE_OTHER): Payer: Self-pay

## 2019-02-02 ENCOUNTER — Encounter (INDEPENDENT_AMBULATORY_CARE_PROVIDER_SITE_OTHER): Payer: Self-pay

## 2019-02-03 ENCOUNTER — Encounter (INDEPENDENT_AMBULATORY_CARE_PROVIDER_SITE_OTHER): Payer: Self-pay

## 2019-02-04 ENCOUNTER — Encounter (INDEPENDENT_AMBULATORY_CARE_PROVIDER_SITE_OTHER): Payer: Self-pay

## 2019-02-05 ENCOUNTER — Encounter (INDEPENDENT_AMBULATORY_CARE_PROVIDER_SITE_OTHER): Payer: Self-pay

## 2019-02-08 ENCOUNTER — Encounter (INDEPENDENT_AMBULATORY_CARE_PROVIDER_SITE_OTHER): Payer: Self-pay

## 2019-07-11 ENCOUNTER — Other Ambulatory Visit: Payer: Self-pay

## 2019-07-11 ENCOUNTER — Emergency Department (HOSPITAL_COMMUNITY)
Admission: EM | Admit: 2019-07-11 | Discharge: 2019-07-11 | Disposition: A | Discharge status: Home / Self Care | Payer: BC Managed Care – PPO | Attending: Emergency Medicine | Admitting: Emergency Medicine

## 2019-07-11 ENCOUNTER — Encounter (HOSPITAL_COMMUNITY): Payer: Self-pay | Admitting: *Deleted

## 2019-07-11 DIAGNOSIS — R1110 Vomiting, unspecified: Secondary | ICD-10-CM | POA: Insufficient documentation

## 2019-07-11 DIAGNOSIS — Z5321 Procedure and treatment not carried out due to patient leaving prior to being seen by health care provider: Secondary | ICD-10-CM | POA: Diagnosis not present

## 2019-07-11 LAB — COMPREHENSIVE METABOLIC PANEL
ALT: 18 U/L (ref 0–44)
AST: 19 U/L (ref 15–41)
Albumin: 4.3 g/dL (ref 3.5–5.0)
Alkaline Phosphatase: 85 U/L (ref 38–126)
Anion gap: 12 (ref 5–15)
BUN: 11 mg/dL (ref 6–20)
CO2: 19 mmol/L — ABNORMAL LOW (ref 22–32)
Calcium: 9.7 mg/dL (ref 8.9–10.3)
Chloride: 106 mmol/L (ref 98–111)
Creatinine, Ser: 0.67 mg/dL (ref 0.44–1.00)
GFR calc Af Amer: >60 mL/min (ref >60)
GFR calc non Af Amer: >60 mL/min (ref >60)
Glucose, Bld: 146 mg/dL — ABNORMAL HIGH (ref 70–99)
Potassium: 3.4 mmol/L — ABNORMAL LOW (ref 3.5–5.1)
Sodium: 137 mmol/L (ref 135–145)
Total Bilirubin: 0.7 mg/dL (ref 0.3–1.2)
Total Protein: 6.9 g/dL (ref 6.5–8.1)

## 2019-07-11 LAB — CBC
HCT: 44.7 % (ref 36.0–46.0)
Hemoglobin: 15.2 g/dL — ABNORMAL HIGH (ref 12.0–15.0)
MCH: 28.7 pg (ref 26.0–34.0)
MCHC: 34 g/dL (ref 30.0–36.0)
MCV: 84.3 fL (ref 80.0–100.0)
Platelets: 347 10*3/uL (ref 150–400)
RBC: 5.3 MIL/uL — ABNORMAL HIGH (ref 3.87–5.11)
RDW: 12.2 % (ref 11.5–15.5)
WBC: 17.8 10*3/uL — ABNORMAL HIGH (ref 4.0–10.5)
nRBC: 0 % (ref 0.0–0.2)

## 2019-07-11 LAB — LIPASE, BLOOD: Lipase: 24 U/L (ref 11–51)

## 2019-07-11 LAB — I-STAT BETA HCG BLOOD, ED (MC, WL, AP ONLY): I-stat hCG, quantitative: <5 m[IU]/mL (ref <5)

## 2019-07-11 MED ORDER — SODIUM CHLORIDE 0.9% FLUSH: 3.0000 mL | Freq: Once | INTRAVENOUS | Status: DC

## 2019-07-11 MED ORDER — ONDANSETRON 4 MG PO TBDP
4.0000 mg | ORAL_TABLET | Freq: Once | ORAL | Status: AC | PRN
  Administered 2019-07-11: 4 mg via ORAL
  Filled 2019-07-11: qty 1

## 2019-07-11 NOTE — ED Notes (Signed)
Pt stated they were leaving  

## 2019-09-08 ENCOUNTER — Other Ambulatory Visit: Payer: Self-pay

## 2019-09-08 ENCOUNTER — Ambulatory Visit
Admission: RE | Admit: 2019-09-08 | Discharge: 2019-09-08 | Disposition: A | Discharge status: Home / Self Care | Payer: BC Managed Care – PPO | Source: Ambulatory Visit | Attending: Physician Assistant | Admitting: Physician Assistant

## 2019-09-08 VITALS — BP 138/84 | HR 91 | Temp 98.0°F | Resp 18

## 2019-09-08 DIAGNOSIS — R42 Dizziness and giddiness: Secondary | ICD-10-CM

## 2019-09-08 MED ORDER — FLUTICASONE PROPIONATE 50 MCG/ACT NA SUSP: 2.0000 | Freq: Every day | NASAL | 0 refills | Status: DC

## 2019-09-08 MED ORDER — MECLIZINE HCL 12.5 MG PO TABS: 12.5000 mg | ORAL_TABLET | Freq: Three times a day (TID) | ORAL | 0 refills | Status: DC | PRN

## 2019-09-08 NOTE — Discharge Instructions (Signed)
No alarming signs on exam. Start flonase for possible eustachian tube dysfunction causing symptoms. Meclizine as needed for dizziness. Keep hydrated, urine should be clear to pale yellow in color. Follow up with PCP if symptoms not improving. If worsening symptoms, passing out, falling multiple times, go to the ED for further evaluation.

## 2019-09-08 NOTE — ED Provider Notes (Signed)
[facility]    CSN: [id number] Arrival date & time: 09/08/19  1610      History   Chief Complaint Chief Complaint  Patient presents with   Dizziness    HPI Molly Martin is a 24 y.o. female.   23 year old female comes in for dizziness. States has had a few months of dizziness when going from sitting to standing position. However, today, started having spinning sensation with head movement and came in for evaluation. No head injury, loss of consciousness. Has left sided throbbing/stabbing headache. Nausea without vomiting. Some photophobia without phonophobia. Denies vision changes. Denies URI symptoms, fever. Denies blood loss, excessive sweating, diarrhea.      Past Medical History:  Diagnosis Date   Anemia    Anxiety    Depression     Patient Active Problem List   Diagnosis Date Noted   Vacuum extractor delivery, delivered 12/24/2018   Severe preeclampsia 12/22/2018   Elevated blood pressure affecting pregnancy, antepartum 12/16/2018   Dog bite of left hand 03/31/2018   Yeast vaginitis 06/08/2016    Past Surgical History:  Procedure Laterality Date   I & D EXTREMITY Right 03/31/2018   Procedure: IRRIGATION AND DEBRIDEMENT EXTREMITY right hand;  Surgeon: Renette Butters, MD;  Location: [facility];  Service: Orthopedics;  Laterality: Right;   TONSILLECTOMY      OB History    Gravida  1   Para  1   Term  0   Preterm  1   AB  0   Living  1     SAB  0   TAB  0   Ectopic  0   Multiple  0   Live Births  1           Home Medications    Prior to Admission medications   Medication Sig Start Date End Date Taking? Authorizing Provider  acetaminophen (TYLENOL) 325 MG tablet Take 650 mg by mouth every 6 (six) hours as needed.    [provider]  escitalopram (LEXAPRO) 20 MG tablet Take 20 mg by mouth daily. 11/18/18   [provider]  fluticasone (FLONASE) 50 MCG/ACT nasal spray Place 2 sprays into both  nostrils daily. 09/08/19   Tasia Catchings, Lonia Roane V, PA-C  Iron Combinations (CHROMAGEN) capsule Take 1 capsule by mouth daily. Taking 65mg  daily    [provider]  meclizine (ANTIVERT) 12.5 MG tablet Take 1-2 tablets (12.5-25 mg total) by mouth 3 (three) times daily as needed for dizziness. 09/08/19   Ok Edwards, PA-C    Family History Family History  Problem Relation Age of Onset   Bipolar disorder Mother    Hypertension Father    Depression Sister    Stroke Maternal Grandmother    Cancer Maternal Grandfather    Anxiety disorder Paternal Grandmother     Social History Social History   Tobacco Use   Smoking status: Never Smoker   Smokeless tobacco: Never Used  Scientific laboratory technician Use: Never used  Substance Use Topics   Alcohol use: No   Drug use: No     Allergies   Patient has no known allergies.   Review of Systems Review of Systems  Reason unable to perform ROS: See HPI as above.     Physical Exam Triage Vital Signs ED Triage Vitals [09/08/19 1625]  Enc Vitals Group     BP 138/84     Pulse Rate 91     Resp 18  Temp 98 F (36.7 C)     Temp Source Oral     SpO2 96 %     Weight      Height      Head Circumference      Peak Flow      Pain Score 4     Pain Loc      Pain Edu?      Excl. in GC?    Orthostatic VS for the past 24 hrs:  BP- Lying Pulse- Lying BP- Sitting Pulse- Sitting BP- Standing at 0 minutes Pulse- Standing at 0 minutes  09/08/19 1629 114/76 93 123/79 92 116/80 96    Updated Vital Signs BP 138/84 (BP Location: Left Arm)    Pulse 91    Temp 98 F (36.7 C) (Oral)    Resp 18    SpO2 96%    Breastfeeding No   Physical Exam Constitutional:      General: She is not in acute distress.    Appearance: Normal appearance. She is well-developed. She is not toxic-appearing or diaphoretic.  HENT:     Head: Normocephalic and atraumatic.     Right Ear: Ear canal and external ear normal. Tympanic membrane is erythematous. Tympanic membrane  is not bulging.     Left Ear: Ear canal and external ear normal. Tympanic membrane is erythematous. Tympanic membrane is not bulging.     Mouth/Throat:     Mouth: Mucous membranes are moist.     Pharynx: Oropharynx is clear. Uvula midline.  Eyes:     Extraocular Movements: Extraocular movements intact.     Right eye: No nystagmus.     Left eye: No nystagmus.     Conjunctiva/sclera: Conjunctivae normal.     Pupils: Pupils are equal, round, and reactive to light.  Cardiovascular:     Rate and Rhythm: Normal rate and regular rhythm.     Heart sounds: No murmur heard.  No friction rub. No gallop.   Pulmonary:     Effort: Pulmonary effort is normal. No respiratory distress.     Comments: Speaking in full sentences without difficulty. LCTAB Musculoskeletal:     Cervical back: Normal range of motion and neck supple.  Skin:    General: Skin is warm and dry.  Neurological:     Mental Status: She is alert and oriented to person, place, and time.     GCS: GCS eye subscore is 4. GCS verbal subscore is 5. GCS motor subscore is 6.     Comments: Cranial nerves II-XII grossly intact. Strength 5/5 bilaterally for upper and lower extremity. Sensation intact. Normal coordination with normal finger to nose, heel to shin. Negative pronator drift, romberg. Gait intact. Able to ambulate on own without difficulty.        UC Treatments / Results  Labs (all labs ordered are listed, but only abnormal results are displayed) Labs Reviewed - No data to display  EKG   Radiology No results found.  Procedures Procedures (including critical care time)  Medications Ordered in UC Medications - No data to display  Initial Impression / Assessment and Plan / UC Course  I have reviewed the triage vital signs and the nursing notes.  Pertinent labs & imaging results that were available during my care of the patient were reviewed by me and considered in my medical decision making (see chart for details).     No alarming signs on exam.  Neurology exam grossly intact without focal deficits.  Bilateral TMs slightly  erythematous, no bulging.  Discussed possible eustachian tube dysfunction contributing to symptoms.  Also ?  Migraine versus vertigo causing symptoms as well.  Offered Toradol injection for possible migraine, for which patient declined.  At this time, start Flonase for possible eustachian tube dysfunction, meclizine as needed for dizziness.  Push fluids.  Return precautions given.  Follow-up with PCP if symptoms not improving.  Patient expresses understanding and agrees to plan.  Final Clinical Impressions(s) / UC Diagnoses   Final diagnoses:  Dizziness    ED Prescriptions    Medication Sig Dispense Auth. Provider   fluticasone (FLONASE) 50 MCG/ACT nasal spray Place 2 sprays into both nostrils daily. 1 g Shirell Struthers V, PA-C   meclizine (ANTIVERT) 12.5 MG tablet Take 1-2 tablets (12.5-25 mg total) by mouth 3 (three) times daily as needed for dizziness. 20 tablet Belinda Fisher, PA-C     PDMP not reviewed this encounter.   Belinda Fisher, PA-C 09/08/19 1700

## 2019-09-08 NOTE — ED Triage Notes (Signed)
Pt c/o dizziness since 2pm today with a "bee string" feeling to lt side of forehead. States his has been going on for the past few months but worse today.

## 2020-02-27 ENCOUNTER — Ambulatory Visit (HOSPITAL_COMMUNITY)
Admission: EM | Admit: 2020-02-27 | Discharge: 2020-02-27 | Disposition: A | Discharge status: Home / Self Care | Payer: BC Managed Care – PPO | Attending: Emergency Medicine | Admitting: Emergency Medicine

## 2020-02-27 ENCOUNTER — Encounter (HOSPITAL_COMMUNITY): Payer: Self-pay

## 2020-02-27 ENCOUNTER — Other Ambulatory Visit: Payer: Self-pay

## 2020-02-27 ENCOUNTER — Ambulatory Visit (HOSPITAL_COMMUNITY): Payer: Self-pay

## 2020-02-27 DIAGNOSIS — Z20822 Contact with and (suspected) exposure to covid-19: Secondary | ICD-10-CM | POA: Diagnosis not present

## 2020-02-27 DIAGNOSIS — R051 Acute cough: Secondary | ICD-10-CM | POA: Insufficient documentation

## 2020-02-27 DIAGNOSIS — J0100 Acute maxillary sinusitis, unspecified: Secondary | ICD-10-CM | POA: Insufficient documentation

## 2020-02-27 DIAGNOSIS — F32A Depression, unspecified: Secondary | ICD-10-CM | POA: Diagnosis not present

## 2020-02-27 DIAGNOSIS — F419 Anxiety disorder, unspecified: Secondary | ICD-10-CM | POA: Insufficient documentation

## 2020-02-27 DIAGNOSIS — Z79899 Other long term (current) drug therapy: Secondary | ICD-10-CM | POA: Diagnosis not present

## 2020-02-27 DIAGNOSIS — J029 Acute pharyngitis, unspecified: Secondary | ICD-10-CM | POA: Diagnosis present

## 2020-02-27 DIAGNOSIS — D649 Anemia, unspecified: Secondary | ICD-10-CM | POA: Insufficient documentation

## 2020-02-27 LAB — RESP PANEL BY RT-PCR (FLU A&B, COVID) ARPGX2
Influenza A by PCR: NEGATIVE
Influenza B by PCR: NEGATIVE
SARS Coronavirus 2 by RT PCR: NEGATIVE

## 2020-02-27 MED ORDER — AMOXICILLIN 875 MG PO TABS: 875.0000 mg | ORAL_TABLET | Freq: Two times a day (BID) | ORAL | 0 refills | Status: AC

## 2020-02-27 NOTE — ED Triage Notes (Signed)
Pt presents with a congestions, sore throat, pt states it is painful to swallow x 5 days.  Pt states she has trouble breathing since Tuesday.  Pt states she has been experiencing dizziness. Pt states she has been having a non productive cough. Pt states she has been having sharp pain on her right side of abdomen.

## 2020-02-27 NOTE — Discharge Instructions (Signed)
Take the amoxicillin as directed.    Your COVID and Flu tests are pending.  You should self quarantine until the test results are back.    Take Tylenol or ibuprofen as needed for fever or discomfort.  Rest and keep yourself hydrated.    Follow-up with your primary care provider if your symptoms are not improving.     

## 2020-02-27 NOTE — ED Provider Notes (Signed)
[facility]    CSN: [id number] Arrival date & time: 02/27/20  1439      History   Chief Complaint Chief Complaint  Patient presents with  . Nasal Congestion  . Sore Throat  . Dizziness  . Shortness of Breath    HPI Molly Martin is a 24 y.o. female.   Patient presents with sinus congestion, sore throat, nonproductive cough, shortness of breath, pain under her bilateral rib cage with coughing, dizziness x5 days.  She also reports a low-grade fever of T-max 99.9.  She denies rash, vomiting, diarrhea, or other symptoms.  She had an E-visit 4 days ago and was prescribed a cough medication but states this has not helped.  She states she had a negative Covid test 3 days ago.  Her medical history includes anemia, anxiety, depression.  The history is provided by the patient.    Past Medical History:  Diagnosis Date  . Anemia   . Anxiety   . Depression     Patient Active Problem List   Diagnosis Date Noted  . Vacuum extractor delivery, delivered 12/24/2018  . Severe preeclampsia 12/22/2018  . Elevated blood pressure affecting pregnancy, antepartum 12/16/2018  . Dog bite of left hand 03/31/2018  . Yeast vaginitis 06/08/2016    Past Surgical History:  Procedure Laterality Date  . I & D EXTREMITY Right 03/31/2018   Procedure: IRRIGATION AND DEBRIDEMENT EXTREMITY right hand;  Surgeon: Sheral Apley, MD;  Location: [facility];  Service: Orthopedics;  Laterality: Right;  . TONSILLECTOMY      OB History    Gravida  1   Para  1   Term  0   Preterm  1   AB  0   Living  1     SAB  0   IAB  0   Ectopic  0   Multiple  0   Live Births  1            Home Medications    Prior to Admission medications   Medication Sig Start Date End Date Taking? Authorizing Provider  escitalopram (LEXAPRO) 20 MG tablet Take 20 mg by mouth daily. 11/18/18  Yes [provider]  acetaminophen (TYLENOL) 325 MG tablet Take 650 mg by mouth every 6 (six) hours  as needed.    [provider]  amoxicillin (AMOXIL) 875 MG tablet Take 1 tablet (875 mg total) by mouth 2 (two) times daily for 7 days. 02/27/20 03/05/20  Mickie Bail, NP  fluticasone (FLONASE) 50 MCG/ACT nasal spray Place 2 sprays into both nostrils daily. 09/08/19   Cathie Hoops, Amy V, PA-C  Iron Combinations (CHROMAGEN) capsule Take 1 capsule by mouth daily. Taking 65mg  daily    [provider]  meclizine (ANTIVERT) 12.5 MG tablet Take 1-2 tablets (12.5-25 mg total) by mouth 3 (three) times daily as needed for dizziness. 09/08/19   09/10/19, PA-C    Family History Family History  Problem Relation Age of Onset  . Bipolar disorder Mother   . Hypertension Father   . Depression Sister   . Stroke Maternal Grandmother   . Cancer Maternal Grandfather   . Anxiety disorder Paternal Grandmother     Social History Social History   Tobacco Use  . Smoking status: Never Smoker  . Smokeless tobacco: Never Used  Vaping Use  . Vaping Use: Never used  Substance Use Topics  . Alcohol use: No  . Drug use: No     Allergies  Patient has no known allergies.   Review of Systems Review of Systems  Constitutional: Negative for chills and fever.  HENT: Positive for congestion, postnasal drip, rhinorrhea and sore throat. Negative for ear pain.   Eyes: Negative for pain and visual disturbance.  Respiratory: Positive for cough and shortness of breath.   Cardiovascular: Negative for chest pain and palpitations.  Gastrointestinal: Negative for abdominal pain, diarrhea and vomiting.  Genitourinary: Negative for dysuria and hematuria.  Musculoskeletal: Negative for arthralgias and back pain.  Skin: Negative for color change and rash.  Neurological: Positive for dizziness. Negative for seizures, syncope, weakness and numbness.  All other systems reviewed and are negative.    Physical Exam Triage Vital Signs ED Triage Vitals  Enc Vitals Group     BP 02/27/20 1510 112/87     Pulse  Rate 02/27/20 1510 (!) 102     Resp 02/27/20 1510 18     Temp 02/27/20 1510 99.3 F (37.4 C)     Temp Source 02/27/20 1510 Oral     SpO2 02/27/20 1510 98 %     Weight --      Height --      Head Circumference --      Peak Flow --      Pain Score 02/27/20 1508 8     Pain Loc --      Pain Edu? --      Excl. in GC? --    No data found.  Updated Vital Signs BP 112/87 (BP Location: Left Arm)   Pulse (!) 102   Temp 99.3 F (37.4 C) (Oral)   Resp 18   SpO2 98%   Visual Acuity Right Eye Distance:   Left Eye Distance:   Bilateral Distance:    Right Eye Near:   Left Eye Near:    Bilateral Near:     Physical Exam Vitals and nursing note reviewed.  Constitutional:      General: She is not in acute distress.    Appearance: She is well-developed and well-nourished. She is not ill-appearing.  HENT:     Head: Normocephalic and atraumatic.     Right Ear: Tympanic membrane normal.     Left Ear: Tympanic membrane normal.     Nose: Congestion and rhinorrhea present.     Mouth/Throat:     Mouth: Mucous membranes are moist.     Pharynx: Oropharynx is clear.  Eyes:     Conjunctiva/sclera: Conjunctivae normal.  Cardiovascular:     Rate and Rhythm: Normal rate and regular rhythm.     Heart sounds: Normal heart sounds.  Pulmonary:     Effort: Pulmonary effort is normal. No respiratory distress.     Breath sounds: Normal breath sounds. No wheezing or rhonchi.  Abdominal:     General: Bowel sounds are normal.     Palpations: Abdomen is soft.     Tenderness: There is no abdominal tenderness. There is no guarding or rebound.  Musculoskeletal:        General: No edema.     Cervical back: Neck supple.  Skin:    General: Skin is warm and dry.     Findings: No rash.  Neurological:     General: No focal deficit present.     Mental Status: She is alert and oriented to person, place, and time.     Gait: Gait normal.  Psychiatric:        Mood and Affect: Mood and affect and mood  normal.  Behavior: Behavior normal.      UC Treatments / Results  Labs (all labs ordered are listed, but only abnormal results are displayed) Labs Reviewed  RESP PANEL BY RT-PCR (FLU A&B, COVID) [id number]    EKG   Radiology No results found.  Procedures Procedures (including critical care time)  Medications Ordered in UC Medications - No data to display  Initial Impression / Assessment and Plan / UC Course  I have reviewed the triage vital signs and the nursing notes.  Pertinent labs & imaging results that were available during my care of the patient were reviewed by me and considered in my medical decision making (see chart for details).   Acute sinusitis, sore throat.  Treating with amoxicillin.  Influenza and COVID pending.  Instructed patient to self quarantine until the test results are back.  Discussed symptomatic treatment including Tylenol, rest, hydration.  Instructed patient to follow up with PCP if her symptoms are not improving  Patient agrees to plan of care.    Final Clinical Impressions(s) / UC Diagnoses   Final diagnoses:  Acute non-recurrent maxillary sinusitis  Sore throat     Discharge Instructions     Take the amoxicillin as directed.    Your COVID and Flu tests are pending.  You should self quarantine until the test results are back.    Take Tylenol or ibuprofen as needed for fever or discomfort.  Rest and keep yourself hydrated.    Follow-up with your primary care provider if your symptoms are not improving.            ED Prescriptions    Medication Sig Dispense Auth. Provider   amoxicillin (AMOXIL) 875 MG tablet Take 1 tablet (875 mg total) by mouth 2 (two) times daily for 7 days. 14 tablet Mickie Bail, NP     PDMP not reviewed this encounter.   Mickie Bail, NP 02/27/20 [phone]

## 2020-03-25 IMAGING — US US MFM FETAL BPP W/O NON-STRESS
1 series · 15 of 28 positions shown · non-contrast
Comparison: none

[Series 1: us mfm fetal bpp w/o non-stress · 31 acquisitions, 15 frames shown]
[im 1/31]
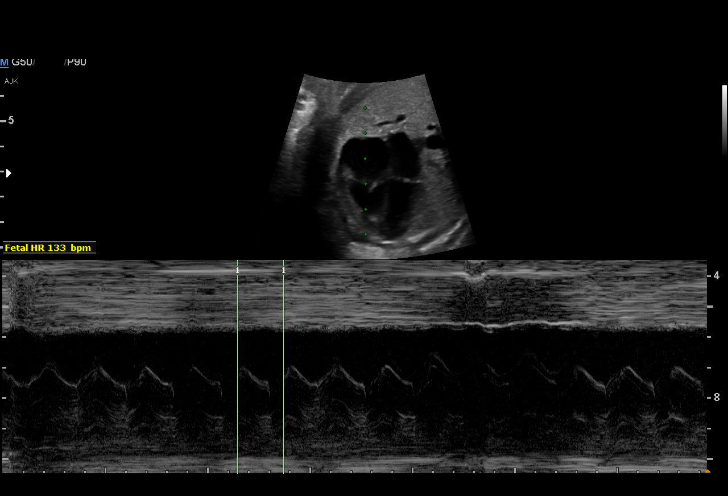
[im 3/31]
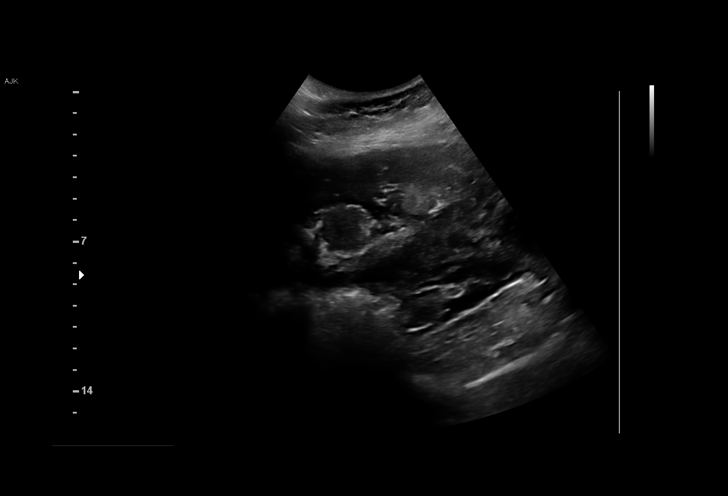
[im 5/31]
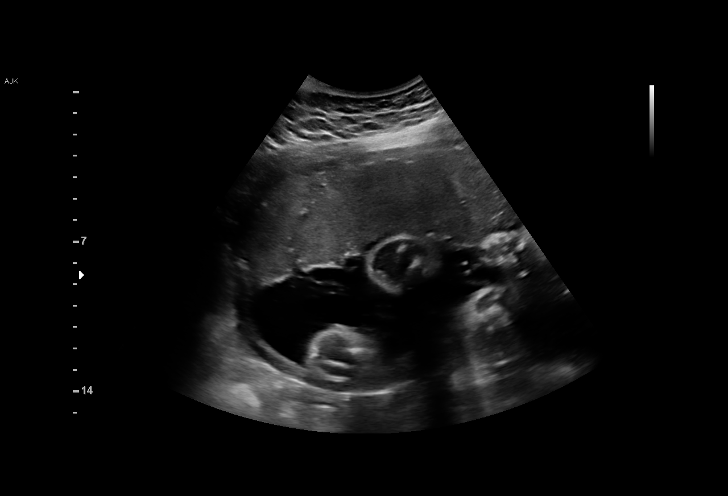
[im 7/31]
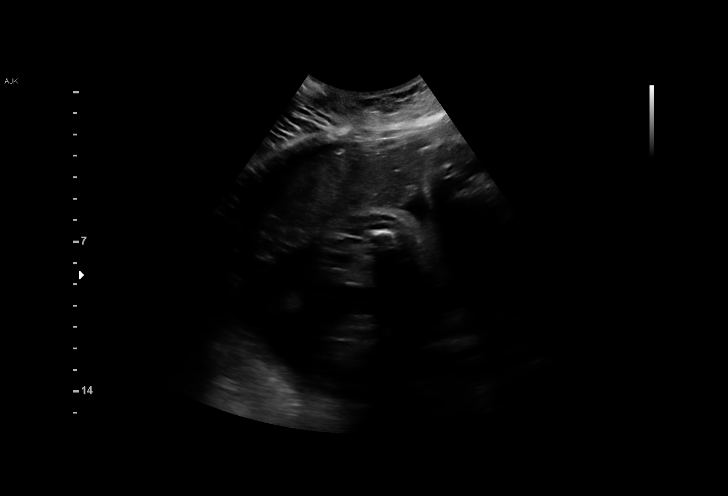
[im 9/31]
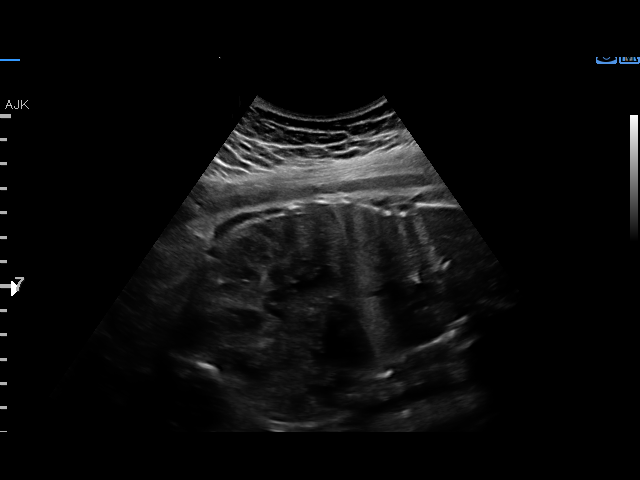
[im 12/31]
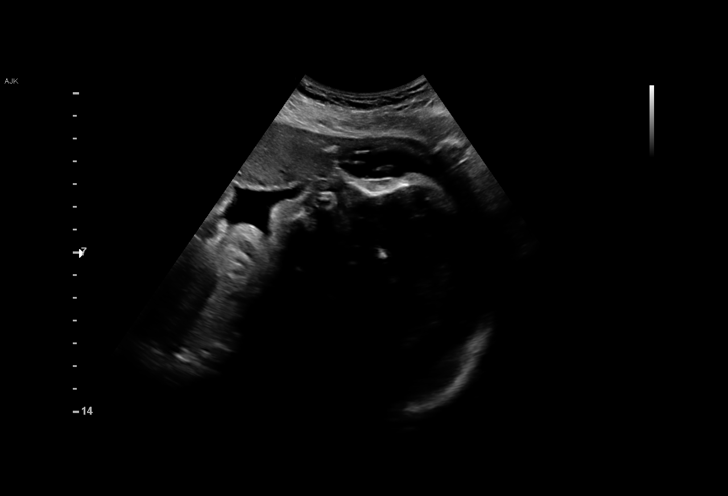
[im 14/31]
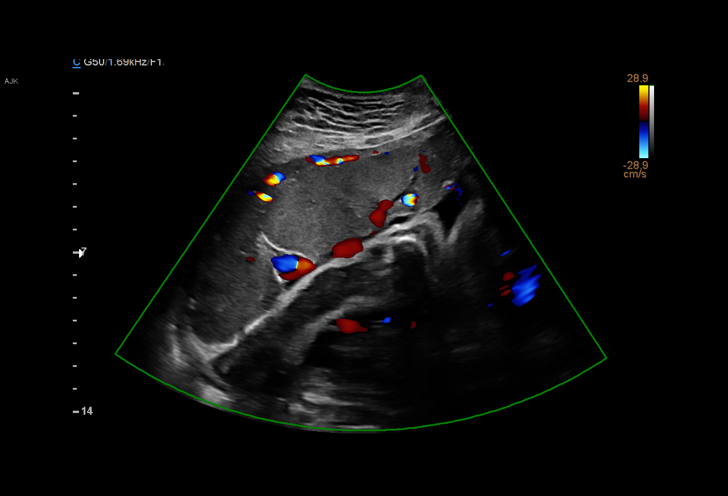
[im 16/31]
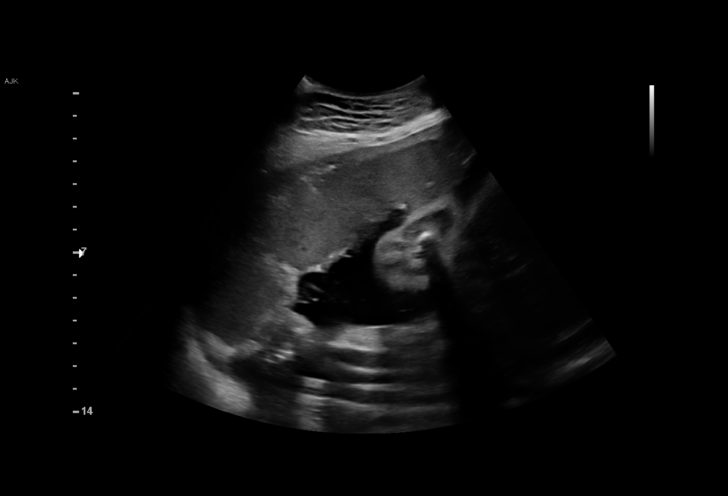
[im 17/31]
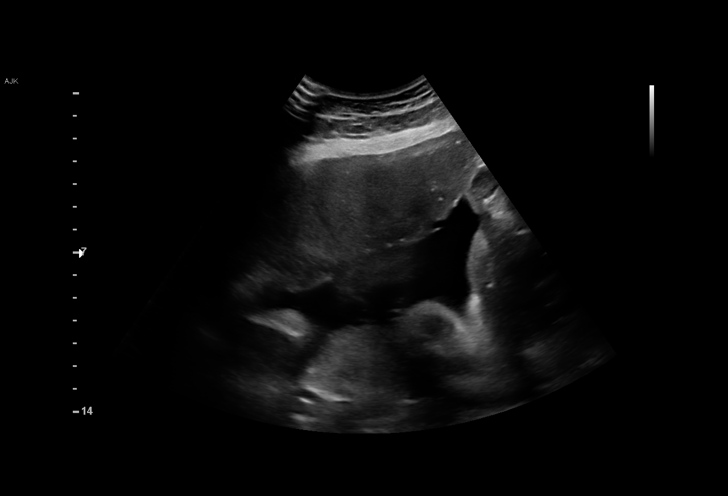
[im 19/31]
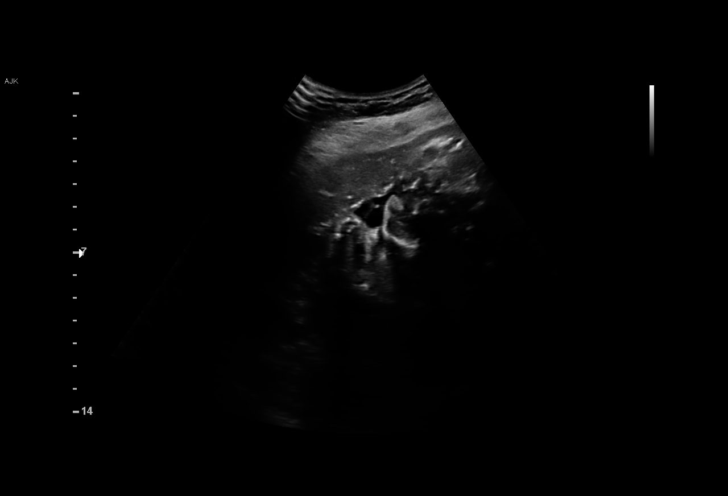
[im 22/31]
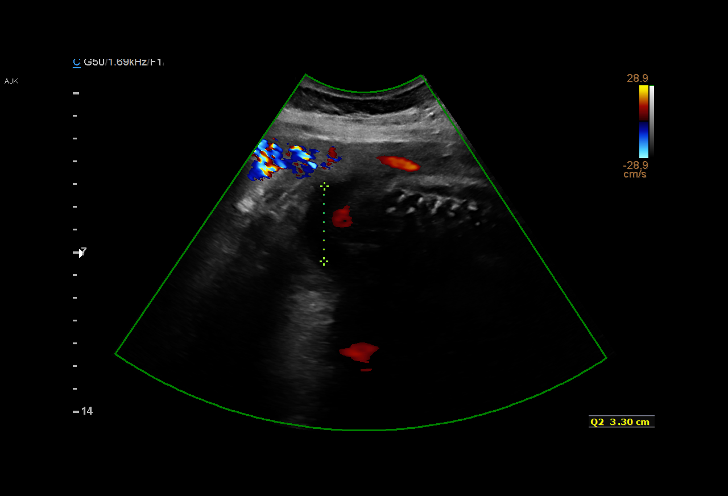
[im 24/31]
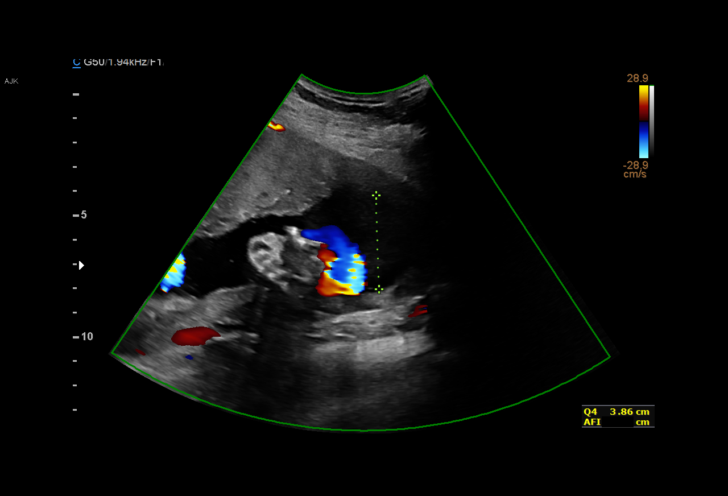
[im 26/31]
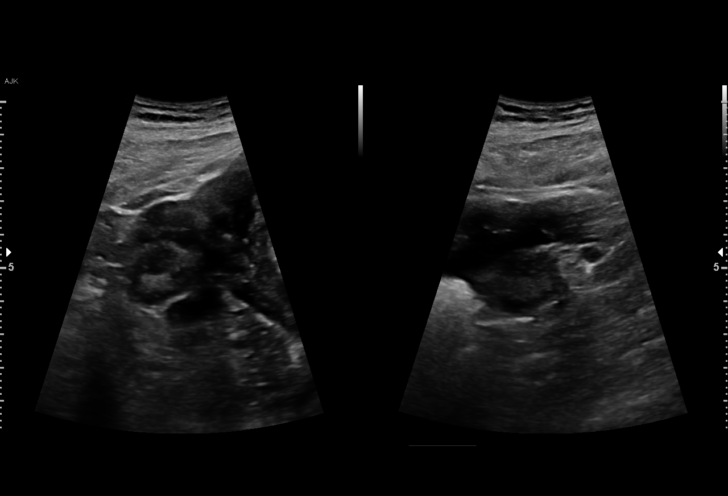
[im 28/31]
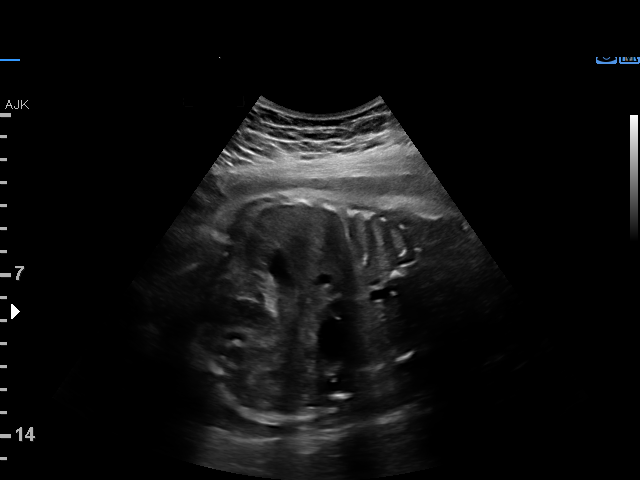
[im 31/31]
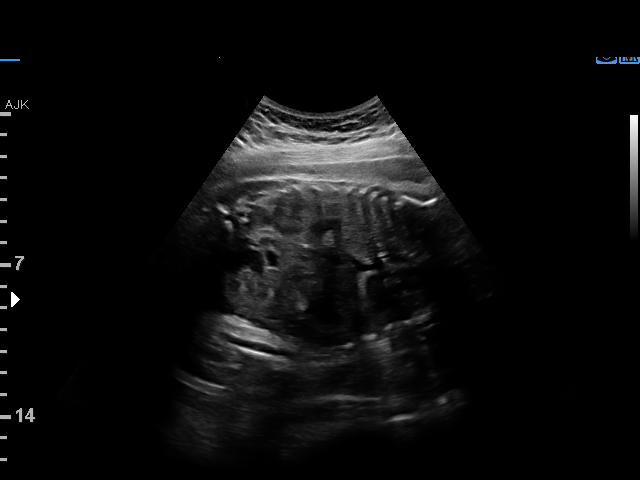

[15 of 28 positions shown; findings below may reference images not displayed]

----------------------------------------------------------------------

 ----------------------------------------------------------------------
Indications

  Fetal heart rate decelerations affecting
  management of mother
  33 weeks gestation of pregnancy
 ----------------------------------------------------------------------
Fetal Evaluation

 Num Of Fetuses:          1
 Fetal Heart Rate(bpm):   133
 Cardiac Activity:        Observed
 Presentation:            Cephalic
 Placenta:                Anterior
 P. Cord Insertion:       Visualized, central

 Amniotic Fluid
 AFI FV:      Within normal limits

 AFI Sum(cm)     %Tile       Largest Pocket(cm)
 14.49           51

 RUQ(cm)       RLQ(cm)       LUQ(cm)        LLQ(cm)


 Comment:    Stomach, bladder, diaphragm seen. No placental abruption or
             previa identified.
Biophysical Evaluation

 Amniotic F.V:   Within normal limits       F. Tone:         Observed
 F. Movement:    Observed                   Score:           [DATE]
 F. Breathing:   Observed
OB History

 Gravidity:    1
Gestational Age

 LMP:           39w 4d        Date:  03/03/18                 EDD:   12/08/18
 Clinical EDD:  33w 6d                                        EDD:   01/17/19
 Best:          33w 6d     Det. By:  Clinical EDD             EDD:   01/17/19
Cervix Uterus Adnexa

 Cervix
 Not visualized (advanced GA >71wks)

 Uterus
 No abnormality visualized.

 Left Ovary
 Not visualized.

 Right Ovary
 Within normal limits.

 Adnexa
 No abnormality visualized.
Impression

 BPP was requested because of decelerations seen on NST.
 Amniotic fluid is normal and good fetal activity is seen.
 Antenatal testing is reassuring. BPP [DATE].
                 Oblez, Alasanni

## 2021-02-23 ENCOUNTER — Ambulatory Visit
Admission: EM | Admit: 2021-02-23 | Discharge: 2021-02-23 | Disposition: A | Discharge status: Home / Self Care | Payer: BC Managed Care – PPO

## 2021-02-23 ENCOUNTER — Other Ambulatory Visit: Payer: Self-pay

## 2021-02-23 DIAGNOSIS — J3489 Other specified disorders of nose and nasal sinuses: Secondary | ICD-10-CM

## 2021-02-23 DIAGNOSIS — J209 Acute bronchitis, unspecified: Secondary | ICD-10-CM

## 2021-02-23 DIAGNOSIS — R0982 Postnasal drip: Secondary | ICD-10-CM

## 2021-02-23 DIAGNOSIS — R051 Acute cough: Secondary | ICD-10-CM

## 2021-02-23 DIAGNOSIS — R0981 Nasal congestion: Secondary | ICD-10-CM

## 2021-02-23 DIAGNOSIS — J9801 Acute bronchospasm: Secondary | ICD-10-CM

## 2021-02-23 MED ORDER — AEROCHAMBER PLUS FLO-VU LARGE MISC: 1.0000 | Freq: Once | 0 refills | Status: AC

## 2021-02-23 MED ORDER — METHYLPREDNISOLONE 4 MG PO TBPK: ORAL_TABLET | ORAL | 0 refills | Status: DC

## 2021-02-23 MED ORDER — IPRATROPIUM BROMIDE 0.06 % NA SOLN: 2.0000 | Freq: Four times a day (QID) | NASAL | 0 refills | Status: DC

## 2021-02-23 MED ORDER — METHYLPREDNISOLONE SODIUM SUCC 125 MG IJ SOLR
125.0000 mg | Freq: Once | INTRAMUSCULAR | Status: AC
  Administered 2021-02-23: 125 mg via INTRAMUSCULAR

## 2021-02-23 MED ORDER — ALBUTEROL SULFATE HFA 108 (90 BASE) MCG/ACT IN AERS: 2.0000 | INHALATION_SPRAY | Freq: Four times a day (QID) | RESPIRATORY_TRACT | 0 refills | Status: AC | PRN

## 2021-02-23 MED ORDER — GUAIFENESIN 400 MG PO TABS: ORAL_TABLET | ORAL | 0 refills | Status: DC

## 2021-02-23 NOTE — ED Provider Notes (Signed)
[facility]    CSN: [id number] Arrival date & time: 02/23/21  1233    HISTORY  No chief complaint on file.  HPI Molly Martin is a 25 y.o. female. Pt reports she has been having a cough, headache, chills, congestion and dizziness since Sunday.  Patient states has been taking DayQuil and NyQuil with some relief but then symptoms returned.  Patient endorses some shortness of breath with cough, states cough is nonproductive.  The history is provided by the patient.  Past Medical History:  Diagnosis Date   Anemia    Anxiety    Depression    Patient Active Problem List   Diagnosis Date Noted   Vacuum extractor delivery, delivered 12/24/2018   Severe preeclampsia 12/22/2018   Elevated blood pressure affecting pregnancy, antepartum 12/16/2018   Dog bite of left hand 03/31/2018   Yeast vaginitis 06/08/2016   Past Surgical History:  Procedure Laterality Date   I & D EXTREMITY Right 03/31/2018   Procedure: IRRIGATION AND DEBRIDEMENT EXTREMITY right hand;  Surgeon: Sheral Apley, MD;  Location: [facility];  Service: Orthopedics;  Laterality: Right;   TONSILLECTOMY     OB History     Gravida  1   Para  1   Term  0   Preterm  1   AB  0   Living  1      SAB  0   IAB  0   Ectopic  0   Multiple  0   Live Births  1          Home Medications    Prior to Admission medications   Medication Sig Start Date End Date Taking? Authorizing Provider  albuterol (VENTOLIN HFA) 108 (90 Base) MCG/ACT inhaler Inhale 2 puffs into the lungs every 6 (six) hours as needed for wheezing or shortness of breath (Cough). 02/23/21  Yes Theadora Rama Scales, PA-C  guaifenesin (HUMIBID E) 400 MG TABS tablet Take 1 tablet 3 times daily as needed for chest congestion and cough 02/23/21  Yes Theadora Rama Scales, PA-C  ipratropium (ATROVENT) 0.06 % nasal spray Place 2 sprays into both nostrils 4 (four) times daily. As needed for nasal congestion, runny nose 02/23/21  Yes Theadora Rama Scales, PA-C  methylPREDNISolone (MEDROL DOSEPAK) 4 MG TBPK tablet Take 24 mg on day 1, 20 mg on day 2, 16 mg on day 3, 12 mg on day 4, 8 mg on day 5, 4 mg on day 6. 02/23/21  Yes Theadora Rama Scales, PA-C  sertraline (ZOLOFT) 100 MG tablet Take 100 mg by mouth daily.   Yes [provider]  vitamin B-12 (CYANOCOBALAMIN) 1000 MCG tablet Take 1,000 mcg by mouth daily.   Yes [provider]  acetaminophen (TYLENOL) 325 MG tablet Take 650 mg by mouth every 6 (six) hours as needed.    [provider]  escitalopram (LEXAPRO) 20 MG tablet Take 20 mg by mouth daily. 11/18/18   [provider]  fluticasone (FLONASE) 50 MCG/ACT nasal spray Place 2 sprays into both nostrils daily. 09/08/19   Cathie Hoops, Amy V, PA-C  Iron Combinations (CHROMAGEN) capsule Take 1 capsule by mouth daily. Taking 65mg  daily    [provider]  meclizine (ANTIVERT) 12.5 MG tablet Take 1-2 tablets (12.5-25 mg total) by mouth 3 (three) times daily as needed for dizziness. 09/08/19   09/10/19, PA-C   Family History Family History  Problem Relation Age of Onset   Bipolar disorder Mother  Hypertension Father    Depression Sister    Stroke Maternal Grandmother    Cancer Maternal Grandfather    Anxiety disorder Paternal Grandmother    Social History Social History   Tobacco Use   Smoking status: Never   Smokeless tobacco: Never  Vaping Use   Vaping Use: Never used  Substance Use Topics   Alcohol use: No   Drug use: No   Allergies   Patient has no known allergies.  Review of Systems Review of Systems Pertinent findings noted in history of present illness.   Physical Exam Triage Vital Signs ED Triage Vitals  Enc Vitals Group     BP 01/14/21 0827 (!) 147/82     Pulse Rate 01/14/21 0827 72     Resp 01/14/21 0827 18     Temp 01/14/21 0827 98.3 F (36.8 C)     Temp Source 01/14/21 0827 Oral     SpO2 01/14/21 0827 98 %     Weight --      Height --      Head  Circumference --      Peak Flow --      Pain Score 01/14/21 0826 5     Pain Loc --      Pain Edu? --      Excl. in GC? --   No data found.  Updated Vital Signs BP 124/85 (BP Location: Right Arm)   Pulse 86   Temp 99.5 F (37.5 C) (Oral)   Resp 18   LMP 02/04/2021   SpO2 96%   Physical Exam Vitals and nursing note reviewed.  Constitutional:      General: She is not in acute distress.    Appearance: Normal appearance. She is not ill-appearing.  HENT:     Head: Normocephalic and atraumatic.     Salivary Glands: Right salivary gland is not diffusely enlarged or tender. Left salivary gland is not diffusely enlarged or tender.     Right Ear: Tympanic membrane, ear canal and external ear normal. No drainage. No middle ear effusion. There is no impacted cerumen. Tympanic membrane is not erythematous or bulging.     Left Ear: Tympanic membrane, ear canal and external ear normal. No drainage.  No middle ear effusion. There is no impacted cerumen. Tympanic membrane is not erythematous or bulging.     Nose: Mucosal edema, congestion and rhinorrhea present. No nasal deformity or septal deviation. Rhinorrhea is clear.     Right Turbinates: Not enlarged, swollen or pale.     Left Turbinates: Not enlarged, swollen or pale.     Right Sinus: No maxillary sinus tenderness or frontal sinus tenderness.     Left Sinus: No maxillary sinus tenderness or frontal sinus tenderness.     Mouth/Throat:     Lips: Pink. No lesions.     Mouth: Mucous membranes are moist. No oral lesions.     Pharynx: Oropharynx is clear. Uvula midline. No posterior oropharyngeal erythema or uvula swelling.     Tonsils: No tonsillar exudate. 0 on the right. 0 on the left.  Eyes:     General: Lids are normal.        Right eye: No discharge.        Left eye: No discharge.     Extraocular Movements: Extraocular movements intact.     Conjunctiva/sclera: Conjunctivae normal.     Right eye: Right conjunctiva is not injected.      Left eye: Left conjunctiva is not injected.  Neck:  Trachea: Trachea and phonation normal.  Cardiovascular:     Rate and Rhythm: Normal rate and regular rhythm.     Pulses: Normal pulses.     Heart sounds: Normal heart sounds. No murmur heard.   No friction rub. No gallop.  Pulmonary:     Effort: Pulmonary effort is normal. No accessory muscle usage, prolonged expiration or respiratory distress.     Breath sounds: No stridor, decreased air movement or transmitted upper airway sounds. Examination of the right-upper field reveals decreased breath sounds. Examination of the left-upper field reveals decreased breath sounds. Examination of the right-middle field reveals decreased breath sounds. Examination of the left-middle field reveals decreased breath sounds. Examination of the right-lower field reveals decreased breath sounds. Examination of the left-lower field reveals decreased breath sounds. Decreased breath sounds present. No wheezing, rhonchi or rales.     Comments: Bronchospasm, coarse breath sounds Chest:     Chest wall: No tenderness.  Musculoskeletal:        General: Normal range of motion.     Cervical back: Normal range of motion and neck supple. Normal range of motion.  Lymphadenopathy:     Cervical: No cervical adenopathy.  Skin:    General: Skin is warm and dry.     Findings: No erythema or rash.  Neurological:     General: No focal deficit present.     Mental Status: She is alert and oriented to person, place, and time.  Psychiatric:        Mood and Affect: Mood normal.        Behavior: Behavior normal.    Visual Acuity Right Eye Distance:   Left Eye Distance:   Bilateral Distance:    Right Eye Near:   Left Eye Near:    Bilateral Near:     UC Couse / Diagnostics / Procedures:    EKG  Radiology No results found.  Procedures Procedures (including critical care time)  UC Diagnoses / Final Clinical Impressions(s)   I have reviewed the triage vital signs  and the nursing notes.  Pertinent labs & imaging results that were available during my care of the patient were reviewed by me and considered in my medical decision making (see chart for details).   Final diagnoses:  Acute bronchitis, unspecified organism  Bronchospasm  Acute cough  Nasal congestion  Rhinorrhea  Postnasal drip   Viral testing not indicated given duration of symptoms.  We will treat patient symptomatically to improve work of breathing, reduce upper airway inflammation.  ED Prescriptions     Medication Sig Dispense Auth. Provider   methylPREDNISolone (MEDROL DOSEPAK) 4 MG TBPK tablet Take 24 mg on day 1, 20 mg on day 2, 16 mg on day 3, 12 mg on day 4, 8 mg on day 5, 4 mg on day 6. 21 tablet Theadora Rama Scales, PA-C   albuterol (VENTOLIN HFA) 108 (90 Base) MCG/ACT inhaler Inhale 2 puffs into the lungs every 6 (six) hours as needed for wheezing or shortness of breath (Cough). 18 g Theadora Rama Scales, PA-C   Spacer/Aero-Holding Chambers (AEROCHAMBER PLUS FLO-VU LARGE) MISC 1 each by Other route once for 1 dose. 1 each Theadora Rama Scales, PA-C   ipratropium (ATROVENT) 0.06 % nasal spray Place 2 sprays into both nostrils 4 (four) times daily. As needed for nasal congestion, runny nose 15 mL Theadora Rama Scales, PA-C   guaifenesin (HUMIBID E) 400 MG TABS tablet Take 1 tablet 3 times daily as needed for chest congestion and  cough 21 tablet Theadora Rama Scales, PA-C      PDMP not reviewed this encounter.  Pending results:  Labs Reviewed - No data to display  Medications Ordered in UC: Medications  methylPREDNISolone sodium succinate (SOLU-MEDROL) 125 mg/2 mL injection 125 mg (125 mg Intramuscular Given 02/23/21 1626)    Disposition Upon Discharge:  Condition: stable for discharge home Home: take medications as prescribed; routine discharge instructions as discussed; follow up as advised.  Patient presented with an acute illness with associated systemic  symptoms and significant discomfort requiring urgent management. In my opinion, this is a condition that a prudent lay person (someone who possesses an average knowledge of health and medicine) may potentially expect to result in complications if not addressed urgently such as respiratory distress, impairment of bodily function or dysfunction of bodily organs.   Routine symptom specific, illness specific and/or disease specific instructions were discussed with the patient and/or caregiver at length.   As such, the patient has been evaluated and assessed, work-up was performed and treatment was provided in alignment with urgent care protocols and evidence based medicine.  Patient/parent/caregiver has been advised that the patient may require follow up for further testing and treatment if the symptoms continue in spite of treatment, as clinically indicated and appropriate.  The patient was tested for COVID-19, Influenza and/or RSV, then the patient/parent/guardian was advised to isolate at home pending the results of his/her diagnostic coronavirus test and potentially longer if they're positive. I have also advised pt that if his/her COVID-19 test returns positive, it's recommended to self-isolate for at least 10 days after symptoms first appeared AND until fever-free for 24 hours without fever reducer AND other symptoms have improved or resolved. Discussed self-isolation recommendations as well as instructions for household member/close contacts as per the [facility] and [place] [facility], and also gave patient the COVID packet with this information.  Patient/parent/caregiver has been advised to return to the [facility] or PCP in 3-5 days if no better; to PCP or the Emergency Department if new signs and symptoms develop, or if the current signs or symptoms continue to change or worsen for further workup, evaluation and treatment as clinically indicated and appropriate  The patient will follow up with their current PCP if and as  advised. If the patient does not currently have a PCP we will assist them in obtaining one.   The patient may need specialty follow up if the symptoms continue, in spite of conservative treatment and management, for further workup, evaluation, consultation and treatment as clinically indicated and appropriate.  Patient/parent/caregiver verbalized understanding and agreement of plan as discussed.  All questions were addressed during visit.  Please see discharge instructions below for further details of plan.  Discharge Instructions:   Discharge Instructions      With acute inflammation in your upper and lower airways, you received an injection of methylprednisolone which should significantly relieve your symptoms for several hours.  Tomorrow morning, please begin taking oral methylprednisolone, 1 row of tablets daily with your breakfast.  To keep congestion at a minimum, please take guaifenesin 400 mg 3 times daily.  To dry up mucous membranes and stop your runny nose and postnasal drip, please use Atrovent nasal spray, 2 sprays in each nare up to 4 times daily  To improve your work of breathing and decrease your frequency of coughing and shortness of breath, please inhale 2 puffs of albuterol 4 times daily for the next 3 days, then decrease to 2 puffs of albuterol 4  times daily only as needed.  Thank you for visiting urgent care today.  I have refilled her same.  If you have not had complete relief of your symptoms in the next 5 to 7 days, please feel free to return for repeat evaluation, possible adjustment of treatment regimen.        Theadora Rama Scales, PA-C 02/24/21 1245

## 2021-02-23 NOTE — Discharge Instructions (Addendum)
With acute inflammation in your upper and lower airways, you received an injection of methylprednisolone which should significantly relieve your symptoms for several hours.  Tomorrow morning, please begin taking oral methylprednisolone, 1 row of tablets daily with your breakfast.  To keep congestion at a minimum, please take guaifenesin 400 mg 3 times daily.  To dry up mucous membranes and stop your runny nose and postnasal drip, please use Atrovent nasal spray, 2 sprays in each nare up to 4 times daily  To improve your work of breathing and decrease your frequency of coughing and shortness of breath, please inhale 2 puffs of albuterol 4 times daily for the next 3 days, then decrease to 2 puffs of albuterol 4 times daily only as needed.  Thank you for visiting urgent care today.  I have refilled her same.  If you have not had complete relief of your symptoms in the next 5 to 7 days, please feel free to return for repeat evaluation, possible adjustment of treatment regimen.

## 2021-02-23 NOTE — ED Triage Notes (Signed)
Pt reports she has been having a cough, headache, chills, congestion and dizziness since Sunday.

## 2022-08-15 ENCOUNTER — Encounter: Payer: Self-pay | Admitting: Neurology

## 2022-09-12 NOTE — Progress Notes (Unsigned)
NEUROLOGY CONSULTATION NOTE  Molly Martin MRN: [id number] DOB: [birth year]  Referring provider: Sena Hitch, OD Primary care provider: [facility]  Reason for consult:  headache  Assessment/Plan:   Migraine without aura, without status migrainosus, not intractable Migraine with aura, without status migrainosus, not intractable   Migraine prevention:  Plan to start Emgality.  Currently taking sertraline.  Has baseline low blood pressure, therefore beta blocker would not be a good option. Migraine rescue:  She will try Nurtec for acute treatment Limit use of pain relievers to no more than 2 days out of week to prevent risk of rebound or medication-overuse headache. Keep headache diary Follow up 6 months.    Subjective:  Molly Martin is a 27 year old female with anemia, anxiety and depression who presents for headaches.  History supplemented by referring provider's note.  MRI of brain on 07/29/2014 personally reviewed.  Onset:  January 2024 Location:  right frontal, sometimes across occipital region, posterior neck Quality:  stabbing Intensity:  6/10.   Aura:  absent Prodrome:  absent Associated symptoms:  dizziness/lightheadedness, nausea, photophobia, phonophobia, visual floaters.  Rarely vomiting.  She denies associated unilateral numbness or weakness. Duration:  sometimes one paroxysmal pain; other times paroxymal stabbing pain throughout the day Frequency:  2-3 days a week Triggers:  prolonged screen time, bright sunlight. Relieving factors:  ice pack Activity:  aggravates  She has migraine with aura since she was around 27 years old.  Bilateral or either eye (mostly right).  Flashes that obscures her vision.  Lasts 10-15 minutes.  Headache lasts several hours (45 minutes with rizatriptan) Associated with nausea, photophobia, phonophobia.  Occurs once a month.  She saw her optometrist last month and had unremarkable eye exam.    She has  remote history of hemiplegic migraine for which MRI of brain with and without contrast was performed on 07/29/2014 and was normal.  Associated with left sided facial and arm and leg numbness and weakness.  She has had a couple of episodes, on in highschool and one in 2016.     Past NSAIDS/analgesics:  Tylenol Past abortive triptans:  rizatriptan Past abortive ergotamine:  none Past muscle relaxants:  none Past anti-emetic:  Zofran Past antihypertensive medications:  labetolol Past antidepressant medications:  Lexapro Past anticonvulsant medications:  none Past anti-CGRP:  Nurtec every other day (effective) Past vitamins/Herbal/Supplements:  none Past antihistamines/decongestants:  Flonase, meclizine Other past therapies:  none  Current NSAIDS/analgesics:  Aleve. Current triptans:  none Current ergotamine:  none Current anti-emetic:  none Current muscle relaxants:  none Current Antihypertensive medications:  none Current Antidepressant medications:  sertraline 100mg  daily Current Anticonvulsant medications:  none Current anti-CGRP:  none Current Vitamins/Herbal/Supplements:  B12 Current Antihistamines/Decongestants:  none Birth control:  no   Caffeine:  Dr. Reino Kent daily.  Rarely coffee Diet:  Tries to drink water.  Also drinks Gatorade and Propel for electrolytes.  Skips meals Exercise:  on her feet all day.  Works as a Production assistant, radio Depression:  stable; Anxiety:  yes Sleep hygiene:  varies.  Has a 25 year old.  Tries to get at least 8 hours.   Family history of migraines:  mother Other family history:  paternal grandmother (seizurds)      PAST MEDICAL HISTORY: Past Medical History:  Diagnosis Date   Anemia    Anxiety    Depression     PAST SURGICAL HISTORY: Past Surgical History:  Procedure Laterality Date   I & D EXTREMITY Right 03/31/2018  Procedure: IRRIGATION AND DEBRIDEMENT EXTREMITY right hand;  Surgeon: Sheral Apley, MD;  Location: [facility];  Service: Orthopedics;   Laterality: Right;   TONSILLECTOMY      MEDICATIONS: Current Outpatient Medications on File Prior to Visit  Medication Sig Dispense Refill   acetaminophen (TYLENOL) 325 MG tablet Take 650 mg by mouth every 6 (six) hours as needed.     albuterol (VENTOLIN HFA) 108 (90 Base) MCG/ACT inhaler Inhale 2 puffs into the lungs every 6 (six) hours as needed for wheezing or shortness of breath (Cough). 18 g 0   escitalopram (LEXAPRO) 20 MG tablet Take 20 mg by mouth daily.     fluticasone (FLONASE) 50 MCG/ACT nasal spray Place 2 sprays into both nostrils daily. 1 g 0   guaifenesin (HUMIBID E) 400 MG TABS tablet Take 1 tablet 3 times daily as needed for chest congestion and cough 21 tablet 0   ipratropium (ATROVENT) 0.06 % nasal spray Place 2 sprays into both nostrils 4 (four) times daily. As needed for nasal congestion, runny nose 15 mL 0   Iron Combinations (CHROMAGEN) capsule Take 1 capsule by mouth daily. Taking 65mg  daily     meclizine (ANTIVERT) 12.5 MG tablet Take 1-2 tablets (12.5-25 mg total) by mouth 3 (three) times daily as needed for dizziness. 20 tablet 0   methylPREDNISolone (MEDROL DOSEPAK) 4 MG TBPK tablet Take 24 mg on day 1, 20 mg on day 2, 16 mg on day 3, 12 mg on day 4, 8 mg on day 5, 4 mg on day 6. 21 tablet 0   sertraline (ZOLOFT) 100 MG tablet Take 100 mg by mouth daily.     vitamin B-12 (CYANOCOBALAMIN) 1000 MCG tablet Take 1,000 mcg by mouth daily.     No current facility-administered medications on file prior to visit.    ALLERGIES: No Known Allergies  FAMILY HISTORY: Family History  Problem Relation Age of Onset   Bipolar disorder Mother    Hypertension Father    Depression Sister    Stroke Maternal Grandmother    Cancer Maternal Grandfather    Anxiety disorder Paternal Grandmother     Objective:  Blood pressure (!) 98/55, pulse 86, height 5\' 4"  (1.626 m), weight 185 lb (83.9 kg), SpO2 98 %. General: No acute distress.  Patient appears well-groomed.   Head:   Normocephalic/atraumatic Eyes:  fundi examined but not visualized Neck: supple, no paraspinal tenderness, full range of motion Back: No paraspinal tenderness Heart: regular rate and rhythm Lungs: Clear to auscultation bilaterally. Vascular: No carotid bruits. Neurological Exam: Mental status: alert and oriented to person, place, and time, speech fluent and not dysarthric, language intact. Cranial nerves: CN I: not tested CN II: pupils equal, round and reactive to light, visual fields intact CN III, IV, VI:  full range of motion, no nystagmus, no ptosis CN V: facial sensation intact. CN VII: upper and lower face symmetric CN VIII: hearing intact CN IX, X: gag intact, uvula midline CN XI: sternocleidomastoid and trapezius muscles intact CN XII: tongue midline Bulk & Tone: normal, no fasciculations. Motor:  muscle strength 5/5 throughout Sensation:  Pinprick, temperature and vibratory sensation intact. Deep Tendon Reflexes:  2+ throughout,  toes downgoing.   Finger to nose testing:  Without dysmetria.   Heel to shin:  Without dysmetria.   Gait:  Normal station and stride.  Romberg negative.    Thank you for allowing me to take part in the care of this patient.  Shon Millet, DO  CC: Melissa Heisler, OD

## 2022-09-13 ENCOUNTER — Telehealth: Payer: Self-pay

## 2022-09-13 ENCOUNTER — Encounter: Payer: Self-pay | Admitting: Neurology

## 2022-09-13 ENCOUNTER — Ambulatory Visit (INDEPENDENT_AMBULATORY_CARE_PROVIDER_SITE_OTHER): Payer: 59 | Admitting: Neurology

## 2022-09-13 VITALS — BP 98/55 | HR 86 | Ht 64.0 in | Wt 185.0 lb

## 2022-09-13 DIAGNOSIS — G43009 Migraine without aura, not intractable, without status migrainosus: Secondary | ICD-10-CM | POA: Diagnosis not present

## 2022-09-13 DIAGNOSIS — G43109 Migraine with aura, not intractable, without status migrainosus: Secondary | ICD-10-CM

## 2022-09-13 MED ORDER — EMGALITY 120 MG/ML ~~LOC~~ SOAJ: 240.0000 mg | Freq: Once | SUBCUTANEOUS | 0 refills | Status: AC

## 2022-09-13 NOTE — Patient Instructions (Signed)
  Start Emgality - 2 injections for first dose, then 1 injection every 28 days thereafter.   Take Nurtec at earliest onset of headache.  Maximum 1 tablet in 24 hours. Limit use of pain relievers to no more than 2 days out of the week.  These medications include acetaminophen, NSAIDs (ibuprofen/Advil/Motrin, naproxen/Aleve, triptans (Imitrex/sumatriptan), Excedrin, and narcotics.  This will help reduce risk of rebound headaches. Be aware of common food triggers: Routine exercise Stay adequately hydrated (aim for 64 oz water daily) Keep headache diary Maintain proper stress management Maintain proper sleep hygiene Do not skip meals Consider supplements:  magnesium citrate 400mg  daily, riboflavin 400mg  daily, coenzyme Q10 300mg  daily.

## 2022-09-13 NOTE — Telephone Encounter (Signed)
Insurance cards in chart

## 2022-09-13 NOTE — Telephone Encounter (Signed)
PA needed for Emgality 120 mg 

## 2022-09-13 NOTE — Progress Notes (Signed)
Medication Samples have been provided to the patient.  Drug name: Nurtec       Strength: 75 mg        Qty: 2   LOT: [id number]  Exp.Date: 07/2024  Dosing instructions: as needed  The patient has been instructed regarding the correct time, dose, and frequency of taking this medication, including desired effects and most common side effects.   Molly Martin 11:00 AM 09/13/2022

## 2022-09-14 ENCOUNTER — Telehealth: Payer: Self-pay

## 2022-09-14 NOTE — Telephone Encounter (Signed)
PA has been APPROVED from 09/14/2022-12/15/2022 for loading dose.

## 2022-09-14 NOTE — Telephone Encounter (Signed)
*  [facility]  PA request received for Emgality 120MG /ML auto-injectors (migraine)  PA submitted to [employer] and is pending additional questions/determination  Key: [id number]

## 2022-09-14 NOTE — Telephone Encounter (Signed)
PA submitted and is pending additional questions/determination. Will be updated in additional encounter created.  

## 2022-12-21 ENCOUNTER — Ambulatory Visit: Payer: Medicaid Other | Admitting: Neurology

## 2023-03-13 NOTE — Progress Notes (Deleted)
NEUROLOGY FOLLOW UP OFFICE NOTE  Molly Martin [id number]  Assessment/Plan:   Migraine without aura, without status migrainosus, not intractable Migraine with aura, without status migrainosus, not intractable   Migraine prevention:  Emgality *** Migraine rescue:  Nurtec *** Limit use of pain relievers to no more than 2 days out of week to prevent risk of rebound or medication-overuse headache. Keep headache diary Follow up 6 months.    Subjective:  Molly Martin is a 27 year old female with anemia, anxiety and depression who follows up for migraines.  UPDATE: Started Emgality.  ***.  Provided Nurtec samples for acute treatment ***  Intensity:  *** Duration:  *** with Nurtec Frequency:  *** Frequency of abortive medication: *** Current NSAIDS/analgesics:  Aleve. Current triptans:  none Current ergotamine:  none Current anti-emetic:  none Current muscle relaxants:  none Current Antihypertensive medications:  none Current Antidepressant medications:  sertraline 100mg  daily Current Anticonvulsant medications:  none Current anti-CGRP:  Emgality, Nurtec PRN (samples) *** Current Vitamins/Herbal/Supplements:  B12 Current Antihistamines/Decongestants:  none Birth control:  no   Caffeine:  Dr. Reino Kent daily.  Rarely coffee Diet:  Tries to drink water.  Also drinks Gatorade and Propel for electrolytes.  Skips meals Exercise:  on her feet all day.  Works as a Production assistant, radio Depression:  stable; Anxiety:  yes Sleep hygiene:  varies.  Has a 25 year old.  Tries to get at least 8 hours.    HISTORY: Onset:  January 2024 Location:  right frontal, sometimes across occipital region, posterior neck Quality:  stabbing Intensity:  6/10.   Aura:  absent Prodrome:  absent Associated symptoms:  dizziness/lightheadedness, nausea, photophobia, phonophobia, visual floaters.  Rarely vomiting.  She denies associated unilateral numbness or weakness. Duration:  sometimes one paroxysmal pain;  other times paroxymal stabbing pain throughout the day Frequency:  2-3 days a week Triggers:  prolonged screen time, bright sunlight. Relieving factors:  ice pack Activity:  aggravates  She has migraine with aura since she was around 27 years old.  Bilateral or either eye (mostly right).  Flashes that obscures her vision.  Lasts 10-15 minutes.  Headache lasts several hours (45 minutes with rizatriptan) Associated with nausea, photophobia, phonophobia.  Occurs once a month.  She saw her optometrist last month and had unremarkable eye exam.    She has remote history of hemiplegic migraine for which MRI of brain with and without contrast was performed on 07/29/2014 and was normal.  Associated with left sided facial and arm and leg numbness and weakness.  She has had a couple of episodes, on in highschool and one in 2016.     Past NSAIDS/analgesics:  Tylenol Past abortive triptans:  rizatriptan Past abortive ergotamine:  none Past muscle relaxants:  none Past anti-emetic:  Zofran Past antihypertensive medications:  labetolol Past antidepressant medications:  Lexapro Past anticonvulsant medications:  none Past anti-CGRP:  Nurtec every other day (effective) Past vitamins/Herbal/Supplements:  none Past antihistamines/decongestants:  Flonase, meclizine Other past therapies:  none   Family history of migraines:  mother Other family history:  paternal grandmother (seizures)  PAST MEDICAL HISTORY: Past Medical History:  Diagnosis Date   Anemia    Anxiety    Depression     MEDICATIONS: Current Outpatient Medications on File Prior to Visit  Medication Sig Dispense Refill   acetaminophen (TYLENOL) 325 MG tablet Take 650 mg by mouth every 6 (six) hours as needed.     albuterol (VENTOLIN HFA) 108 (90 Base) MCG/ACT inhaler Inhale  2 puffs into the lungs every 6 (six) hours as needed for wheezing or shortness of breath (Cough). 18 g 0   sertraline (ZOLOFT) 100 MG tablet Take 100 mg by mouth  daily.     vitamin B-12 (CYANOCOBALAMIN) 1000 MCG tablet Take 1,000 mcg by mouth as needed.     No current facility-administered medications on file prior to visit.    ALLERGIES: No Known Allergies  FAMILY HISTORY: Family History  Problem Relation Age of Onset   Bipolar disorder Mother    Hypertension Father    Depression Sister    Dementia Maternal Grandmother    Stroke Maternal Grandmother    Dementia Maternal Grandfather    Cancer Maternal Grandfather    Seizures Paternal Grandmother    Anxiety disorder Paternal Grandmother       Objective:  *** General: No acute distress.  Patient appears ***-groomed.   Head:  Normocephalic/atraumatic Eyes:  Fundi examined but not visualized Neck: supple, no paraspinal tenderness, full range of motion Heart:  Regular rate and rhythm Lungs:  Clear to auscultation bilaterally Back: No paraspinal tenderness Neurological Exam: alert and oriented.  Speech fluent and not dysarthric, language intact.  CN II-XII intact. Bulk and tone normal, muscle strength 5/5 throughout.  Sensation to light touch intact.  Deep tendon reflexes 2+ throughout, toes downgoing.  Finger to nose testing intact.  Gait normal, Romberg negative.   Shon Millet, DO  CC: ***

## 2023-03-15 ENCOUNTER — Encounter: Payer: Self-pay | Admitting: Neurology

## 2023-03-15 ENCOUNTER — Ambulatory Visit: Payer: 59 | Admitting: Neurology

## 2023-09-18 ENCOUNTER — Ambulatory Visit (HOSPITAL_BASED_OUTPATIENT_CLINIC_OR_DEPARTMENT_OTHER): Admitting: Student

## 2023-09-24 ENCOUNTER — Ambulatory Visit (HOSPITAL_BASED_OUTPATIENT_CLINIC_OR_DEPARTMENT_OTHER): Admitting: Student

## 2023-09-25 ENCOUNTER — Ambulatory Visit (HOSPITAL_BASED_OUTPATIENT_CLINIC_OR_DEPARTMENT_OTHER): Admitting: Student

## 2023-09-25 ENCOUNTER — Encounter (HOSPITAL_BASED_OUTPATIENT_CLINIC_OR_DEPARTMENT_OTHER): Payer: Self-pay
